# Patient Record
Sex: Male | Born: 1935 | Race: White | Hispanic: No | Marital: Married | Smoking: Former smoker
Health system: Southern US, Community
[De-identification: ages and names within clinical notes are randomized; demographics above are authoritative.]

## PROBLEM LIST (undated history)

## (undated) DIAGNOSIS — K5730 Diverticulosis of large intestine without perforation or abscess without bleeding: Secondary | ICD-10-CM

## (undated) DIAGNOSIS — E785 Hyperlipidemia, unspecified: Secondary | ICD-10-CM

## (undated) DIAGNOSIS — K219 Gastro-esophageal reflux disease without esophagitis: Secondary | ICD-10-CM

## (undated) DIAGNOSIS — N400 Enlarged prostate without lower urinary tract symptoms: Secondary | ICD-10-CM

## (undated) DIAGNOSIS — M1990 Unspecified osteoarthritis, unspecified site: Secondary | ICD-10-CM

## (undated) DIAGNOSIS — I2510 Atherosclerotic heart disease of native coronary artery without angina pectoris: Secondary | ICD-10-CM

## (undated) DIAGNOSIS — Z86010 Personal history of colonic polyps: Secondary | ICD-10-CM

## (undated) HISTORY — DX: Atherosclerotic heart disease of native coronary artery without angina pectoris: I25.10

## (undated) HISTORY — DX: Hyperlipidemia, unspecified: E78.5

## (undated) HISTORY — DX: Gastro-esophageal reflux disease without esophagitis: K21.9

## (undated) HISTORY — DX: Benign prostatic hyperplasia without lower urinary tract symptoms: N40.0

## (undated) HISTORY — DX: Personal history of colonic polyps: Z86.010

## (undated) HISTORY — DX: Diverticulosis of large intestine without perforation or abscess without bleeding: K57.30

## (undated) HISTORY — PX: CORONARY ARTERY BYPASS GRAFT: SHX141

## (undated) HISTORY — DX: Unspecified osteoarthritis, unspecified site: M19.90

## (undated) HISTORY — PX: CATARACT EXTRACTION: SUR2

---

## 2005-05-16 ENCOUNTER — Ambulatory Visit: Payer: Self-pay | Admitting: Internal Medicine

## 2006-05-15 ENCOUNTER — Ambulatory Visit: Payer: Self-pay | Admitting: Internal Medicine

## 2007-03-14 ENCOUNTER — Inpatient Hospital Stay (HOSPITAL_COMMUNITY): Admission: EM | Admit: 2007-03-14 | Discharge: 2007-03-23 | Payer: Self-pay | Admitting: Emergency Medicine

## 2007-03-14 ENCOUNTER — Ambulatory Visit: Payer: Self-pay | Admitting: Cardiology

## 2007-03-16 ENCOUNTER — Encounter: Payer: Self-pay | Admitting: Internal Medicine

## 2007-03-16 ENCOUNTER — Encounter (INDEPENDENT_AMBULATORY_CARE_PROVIDER_SITE_OTHER): Payer: Self-pay | Admitting: Specialist

## 2007-03-18 ENCOUNTER — Encounter: Payer: Self-pay | Admitting: Vascular Surgery

## 2007-03-18 ENCOUNTER — Ambulatory Visit: Payer: Self-pay | Admitting: Surgery

## 2007-03-20 ENCOUNTER — Ambulatory Visit: Payer: Self-pay | Admitting: Internal Medicine

## 2007-04-02 ENCOUNTER — Ambulatory Visit: Payer: Self-pay | Admitting: Cardiovascular Disease

## 2007-04-02 ENCOUNTER — Ambulatory Visit: Payer: Self-pay

## 2007-04-02 LAB — CONVERTED CEMR LAB
CO2: 30 meq/L (ref 19–32)
Chloride: 106 meq/L (ref 96–112)
Creatinine, Ser: 1 mg/dL (ref 0.4–1.5)
Glucose, Bld: 95 mg/dL (ref 70–99)
Potassium: 4.6 meq/L (ref 3.5–5.1)
Pro B Natriuretic peptide (BNP): 312 pg/mL — ABNORMAL HIGH (ref 0.0–100.0)
Sodium: 141 meq/L (ref 135–145)

## 2007-04-05 ENCOUNTER — Ambulatory Visit: Payer: Self-pay | Admitting: Cardiothoracic Surgery

## 2007-04-09 ENCOUNTER — Ambulatory Visit: Payer: Self-pay | Admitting: Cardiology

## 2007-04-09 LAB — CONVERTED CEMR LAB
BUN: 15 mg/dL (ref 6–23)
Basophils Relative: 1.3 % — ABNORMAL HIGH (ref 0.0–1.0)
CO2: 35 meq/L — ABNORMAL HIGH (ref 19–32)
Eosinophils Absolute: 0.1 10*3/uL (ref 0.0–0.6)
GFR calc Af Amer: 75 mL/min
GFR calc non Af Amer: 62 mL/min
Hemoglobin: 11.2 g/dL — ABNORMAL LOW (ref 13.0–17.0)
Lymphocytes Relative: 20.1 % (ref 12.0–46.0)
MCHC: 33.8 g/dL (ref 30.0–36.0)
MCV: 94.8 fL (ref 78.0–100.0)
Monocytes Absolute: 0.6 10*3/uL (ref 0.2–0.7)
Monocytes Relative: 10.3 % (ref 3.0–11.0)
Neutro Abs: 3.9 10*3/uL (ref 1.4–7.7)
Potassium: 4.5 meq/L (ref 3.5–5.1)

## 2007-04-16 ENCOUNTER — Ambulatory Visit: Payer: Self-pay | Admitting: Internal Medicine

## 2007-04-17 LAB — CONVERTED CEMR LAB
Basophils Absolute: 0.1 10*3/uL (ref 0.0–0.1)
Eosinophils Absolute: 0.2 10*3/uL (ref 0.0–0.6)
MCHC: 33.5 g/dL (ref 30.0–36.0)
MCV: 96.3 fL (ref 78.0–100.0)
Platelets: 211 10*3/uL (ref 150–400)
RBC: 3.47 M/uL — ABNORMAL LOW (ref 4.22–5.81)
WBC: 8 10*3/uL (ref 4.5–10.5)

## 2007-04-19 ENCOUNTER — Encounter: Payer: Self-pay | Admitting: Internal Medicine

## 2007-04-19 ENCOUNTER — Ambulatory Visit: Payer: Self-pay | Admitting: Internal Medicine

## 2007-04-30 ENCOUNTER — Ambulatory Visit: Payer: Self-pay | Admitting: Thoracic Surgery (Cardiothoracic Vascular Surgery)

## 2007-04-30 ENCOUNTER — Encounter
Admission: RE | Admit: 2007-04-30 | Discharge: 2007-04-30 | Payer: Self-pay | Admitting: Thoracic Surgery (Cardiothoracic Vascular Surgery)

## 2007-04-30 ENCOUNTER — Encounter: Payer: Self-pay | Admitting: Internal Medicine

## 2007-05-03 ENCOUNTER — Ambulatory Visit: Payer: Self-pay | Admitting: Cardiology

## 2007-05-14 ENCOUNTER — Ambulatory Visit: Payer: Self-pay | Admitting: Internal Medicine

## 2007-05-14 LAB — CONVERTED CEMR LAB
Cholesterol: 103 mg/dL (ref 0–200)
PSA: 0.87 ng/mL (ref 0.10–4.00)

## 2007-06-14 DIAGNOSIS — Z860100 Personal history of colon polyps, unspecified: Secondary | ICD-10-CM | POA: Insufficient documentation

## 2007-06-14 DIAGNOSIS — I2510 Atherosclerotic heart disease of native coronary artery without angina pectoris: Secondary | ICD-10-CM

## 2007-06-14 DIAGNOSIS — K219 Gastro-esophageal reflux disease without esophagitis: Secondary | ICD-10-CM | POA: Insufficient documentation

## 2007-06-14 DIAGNOSIS — K5730 Diverticulosis of large intestine without perforation or abscess without bleeding: Secondary | ICD-10-CM | POA: Insufficient documentation

## 2007-06-14 HISTORY — DX: Gastro-esophageal reflux disease without esophagitis: K21.9

## 2007-06-14 HISTORY — DX: Personal history of colon polyps, unspecified: Z86.0100

## 2007-06-14 HISTORY — DX: Atherosclerotic heart disease of native coronary artery without angina pectoris: I25.10

## 2007-06-14 HISTORY — DX: Diverticulosis of large intestine without perforation or abscess without bleeding: K57.30

## 2007-06-14 HISTORY — DX: Personal history of colonic polyps: Z86.010

## 2007-07-10 ENCOUNTER — Ambulatory Visit: Payer: Self-pay | Admitting: Internal Medicine

## 2007-07-10 DIAGNOSIS — E785 Hyperlipidemia, unspecified: Secondary | ICD-10-CM | POA: Insufficient documentation

## 2007-07-10 DIAGNOSIS — M1990 Unspecified osteoarthritis, unspecified site: Secondary | ICD-10-CM

## 2007-07-10 DIAGNOSIS — I2510 Atherosclerotic heart disease of native coronary artery without angina pectoris: Secondary | ICD-10-CM

## 2007-07-10 DIAGNOSIS — N400 Enlarged prostate without lower urinary tract symptoms: Secondary | ICD-10-CM

## 2007-07-10 HISTORY — DX: Benign prostatic hyperplasia without lower urinary tract symptoms: N40.0

## 2007-07-10 HISTORY — DX: Atherosclerotic heart disease of native coronary artery without angina pectoris: I25.10

## 2007-07-10 HISTORY — DX: Hyperlipidemia, unspecified: E78.5

## 2007-07-10 HISTORY — DX: Unspecified osteoarthritis, unspecified site: M19.90

## 2008-01-01 ENCOUNTER — Ambulatory Visit: Payer: Self-pay | Admitting: Internal Medicine

## 2008-01-03 ENCOUNTER — Encounter: Payer: Self-pay | Admitting: Internal Medicine

## 2008-01-04 ENCOUNTER — Encounter: Payer: Self-pay | Admitting: Internal Medicine

## 2008-04-18 ENCOUNTER — Encounter: Payer: Self-pay | Admitting: Internal Medicine

## 2008-05-13 ENCOUNTER — Ambulatory Visit: Payer: Self-pay | Admitting: Internal Medicine

## 2008-05-13 LAB — CONVERTED CEMR LAB
ALT: 21 units/L (ref 0–53)
Basophils Relative: 0.7 % (ref 0.0–1.0)
Bilirubin, Direct: 0.1 mg/dL (ref 0.0–0.3)
CO2: 31 meq/L (ref 19–32)
Calcium: 9.7 mg/dL (ref 8.4–10.5)
Creatinine, Ser: 1.2 mg/dL (ref 0.4–1.5)
Glucose, Bld: 104 mg/dL — ABNORMAL HIGH (ref 70–99)
Hemoglobin: 14.2 g/dL (ref 13.0–17.0)
LDL Cholesterol: 57 mg/dL (ref 0–99)
Lymphocytes Relative: 23.4 % (ref 12.0–46.0)
Monocytes Relative: 8.2 % (ref 3.0–12.0)
Neutro Abs: 5 10*3/uL (ref 1.4–7.7)
RBC: 4.17 M/uL — ABNORMAL LOW (ref 4.22–5.81)
Sodium: 143 meq/L (ref 135–145)
TSH: 1.25 microintl units/mL (ref 0.35–5.50)
Total CHOL/HDL Ratio: 2.7
Total Protein: 7.3 g/dL (ref 6.0–8.3)
WBC: 7.6 10*3/uL (ref 4.5–10.5)

## 2008-09-02 ENCOUNTER — Ambulatory Visit: Payer: Self-pay | Admitting: Internal Medicine

## 2008-09-02 DIAGNOSIS — R209 Unspecified disturbances of skin sensation: Secondary | ICD-10-CM | POA: Insufficient documentation

## 2009-08-14 ENCOUNTER — Ambulatory Visit: Payer: Self-pay | Admitting: Internal Medicine

## 2010-02-12 ENCOUNTER — Ambulatory Visit: Payer: Self-pay | Admitting: Internal Medicine

## 2010-08-20 ENCOUNTER — Encounter: Payer: Self-pay | Admitting: Internal Medicine

## 2010-08-20 ENCOUNTER — Ambulatory Visit: Payer: Self-pay | Admitting: Internal Medicine

## 2010-08-20 LAB — CONVERTED CEMR LAB
ALT: 13 units/L (ref 0–53)
AST: 18 units/L (ref 0–37)
BUN: 25 mg/dL — ABNORMAL HIGH (ref 6–23)
Basophils Relative: 0.4 % (ref 0.0–3.0)
Chloride: 104 meq/L (ref 96–112)
Cholesterol: 177 mg/dL (ref 0–200)
Eosinophils Absolute: 0.1 10*3/uL (ref 0.0–0.7)
Eosinophils Relative: 0.6 % (ref 0.0–5.0)
GFR calc non Af Amer: 66.74 mL/min (ref >60)
HDL: 44.3 mg/dL (ref >39.00)
LDL Cholesterol: 115 mg/dL — ABNORMAL HIGH (ref 0–99)
Lymphocytes Relative: 40.5 % (ref 12.0–46.0)
MCHC: 34.1 g/dL (ref 30.0–36.0)
Neutrophils Relative %: 51.4 % (ref 43.0–77.0)
Platelets: 183 10*3/uL (ref 150.0–400.0)
Potassium: 4.7 meq/L (ref 3.5–5.1)
RBC: 4.04 M/uL — ABNORMAL LOW (ref 4.22–5.81)
Sodium: 141 meq/L (ref 135–145)
Total Protein: 7.2 g/dL (ref 6.0–8.3)
VLDL: 17.6 mg/dL (ref 0.0–40.0)
WBC: 8.8 10*3/uL (ref 4.5–10.5)

## 2010-12-26 LAB — CONVERTED CEMR LAB
ALT: 19 units/L (ref 0–53)
AST: 18 units/L (ref 0–37)
Albumin: 4 g/dL (ref 3.5–5.2)
Alkaline Phosphatase: 75 units/L (ref 39–117)
Basophils Relative: 0.3 % (ref 0.0–3.0)
Eosinophils Relative: 1 % (ref 0.0–5.0)
GFR calc non Af Amer: 68.32 mL/min (ref >60)
Glucose, Bld: 93 mg/dL (ref 70–99)
HCT: 41.5 % (ref 39.0–52.0)
Hemoglobin: 14.2 g/dL (ref 13.0–17.0)
Lymphs Abs: 2.7 10*3/uL (ref 0.7–4.0)
Monocytes Relative: 7.6 % (ref 3.0–12.0)
Neutro Abs: 4 10*3/uL (ref 1.4–7.7)
Potassium: 4.8 meq/L (ref 3.5–5.1)
RBC: 4.1 M/uL — ABNORMAL LOW (ref 4.22–5.81)
RDW: 12 % (ref 11.5–14.6)
Sodium: 143 meq/L (ref 135–145)
TSH: 1.13 microintl units/mL (ref 0.35–5.50)
Total CHOL/HDL Ratio: 3
VLDL: 16.4 mg/dL (ref 0.0–40.0)
WBC: 7.4 10*3/uL (ref 4.5–10.5)

## 2010-12-30 NOTE — Assessment & Plan Note (Signed)
Summary: 6 mo rov/mm   Vital Signs:  [name] profile:   77 year old male Weight:      183 pounds Temp:     98.3 degrees F oral BP sitting:   130 / 80  (left arm) Cuff size:   regular  Vitals Entered By: Duard Brady LPN (February 12, 2010 8:19 AM) CC: 6 mos rov - doing well Is [name] Diabetic? No   CC:  6 mos rov - doing well.  History of Present Illness: [name] who is seen today for follow-up.  He has a history of coronary artery is dyslipidemia, and mild osteoarthritis.  He does remarkably well.  He is on Lipitor and also Viagra for EGD.  Denies any cardiac symptoms.  He remains quite stable and active.  Laboratory studies were reviewed from the fall and cholesterol was 143.  He has a history of mild gastroesophageal reflux disease controlled with dietary measures.  Allergies (verified): No Known Drug Allergies  Past History:  Past Medical History: Reviewed history from 05/13/2008 and no changes required. Colonic polyps, hx of Diverticulosis, colon GERD Hyperlipidemia Benign prostatic hypertrophy Coronary artery disease status post non-Q-wave MI, April 2008; status post CABG antral ulcer, April 2008 ED  Family History: Reviewed history from 08/14/2009 and no changes required. both parents living to the 90s  he has 4 brothers two sisters- all living  no family history of cancer  Review of Systems  The [name] denies anorexia, fever, weight loss, weight gain, vision loss, decreased hearing, hoarseness, chest pain, syncope, dyspnea on exertion, peripheral edema, prolonged cough, headaches, hemoptysis, abdominal pain, melena, hematochezia, severe indigestion/heartburn, hematuria, incontinence, genital sores, muscle weakness, suspicious skin lesions, transient blindness, difficulty walking, depression, unusual weight change, abnormal bleeding, enlarged lymph nodes, angioedema, breast masses, and testicular masses.    Physical Exam  General:   Well-developed,well-nourished,in no acute distress; alert,appropriate and cooperative throughout examination Head:  Normocephalic and atraumatic without obvious abnormalities. No apparent alopecia or balding. Mouth:  Oral mucosa and oropharynx without lesions or exudates.  Teeth in good repair. Neck:  No deformities, masses, or tenderness noted. Lungs:  Normal respiratory effort, chest expands symmetrically. Lungs are clear to auscultation, no crackles or wheezes. Heart:  Normal rate and regular rhythm. S1 and S2 normal without gallop, murmur, click, rub or other extra sounds. Abdomen:  Bowel sounds positive,abdomen soft and non-tender without masses, organomegaly or hernias noted. Msk:  No deformity or scoliosis noted of thoracic or lumbar spine.   Pulses:  dorsalis pedis pulses, full Extremities:  No clubbing, cyanosis, edema, or deformity noted with normal full range of motion of all joints.     Impression & Recommendations:  Problem # 1:  ATHEROSCLEROTIC CARDIOVASCULAR DISEASE (ICD-429.2)  His updated medication list for this problem includes:    Bayer Aspirin 325 Mg Tabs (Aspirin) .Marland Kitchen... As needed  His updated medication list for this problem includes:    Bayer Aspirin 325 Mg Tabs (Aspirin) .Marland Kitchen... As needed  Problem # 2:  HYPERLIPIDEMIA (ICD-272.4)  His updated medication list for this problem includes:    Lipitor 80 Mg Tabs (Atorvastatin calcium) .Marland Kitchen... 1 once daily  His updated medication list for this problem includes:    Lipitor 80 Mg Tabs (Atorvastatin calcium) .Marland Kitchen... 1 once daily  Complete Medication List: 1)  Lipitor 80 Mg Tabs (Atorvastatin calcium) .Marland Kitchen.. 1 once daily 2)  Viagra 100 Mg Tabs (Sildenafil citrate) .... As directed 3)  Bayer Aspirin 325 Mg Tabs (Aspirin) .... As needed  [name]  Instructions: 1)  Please schedule a follow-up appointment in 6 months. 2)  Advised not to eat any food or drink any liquids after 10 PM the night before your procedure. 3)  It is  important that you exercise regularly at least 20 minutes 5 times a week. If you develop chest pain, have severe difficulty breathing, or feel very tired , stop exercising immediately and seek medical attention. 4)  Take an Aspirin every day. Prescriptions: VIAGRA 100 MG  TABS (SILDENAFIL CITRATE) as directed  #12 x 6   Entered and Authorized by:   Gordy Savers  MD   Signed by:   Gordy Savers  MD on 02/12/2010   Method used:   Print then Give to [name]   RxID:   [id number] LIPITOR 80 MG  TABS (ATORVASTATIN CALCIUM) 1 once daily  #90 Tablet x 6   Entered and Authorized by:   Gordy Savers  MD   Signed by:   Gordy Savers  MD on 02/12/2010   Method used:   Print then Give to [name]   RxID:   [id number]   Appended Document: 6 mo rov/mm     Allergies: No Known Drug Allergies   Complete Medication List: 1)  Lipitor 80 Mg Tabs (Atorvastatin calcium) .Marland Kitchen.. 1 once daily 2)  Viagra 100 Mg Tabs (Sildenafil citrate) .... As directed 3)  Bayer Aspirin 325 Mg Tabs (Aspirin) .... As needed 4)  Ester-c Tabs (Bioflavonoid products) .... Qd 5)  Fish Oil 300 Mg Caps (Omega-3 fatty acids) .... 3qd 6)  Vitamin E 1000 Unit Caps (Vitamin e) .... Qd

## 2010-12-30 NOTE — Assessment & Plan Note (Signed)
Summary: EMP---WILL FAST//CCM   Vital Signs:  Patient profile:   77 year old male Height:      71.5 inches Weight:      175 pounds BMI:     24.15 Temp:     98.0 degrees F oral BP sitting:   140 / 80  (left arm) Cuff size:   regular  Vitals Entered By: Duard Brady LPN (August 20, 2010 8:39 AM) CC: cpx - doing well Is Patient Diabetic? No   CC:  cpx - doing well.  History of Present Illness: 77 year old patient who is in today for a comprehensive evaluation.  He has a history of coronary artery disease and is status post coronary artery bypass grafting.  He has osteoarthritis, which is fairly mild.  Additionally, he has treated dyslipidemia, and a history of BPH.  There is diverticulosis and mild gastroesophageal reflux disease.  He has remote history of colonic polyps. Here for Medicare AWV:  1.   Risk factors based on Past M, S, F history:  patient has known coronary artery disease, status post CABG.  Continues aggressive risk factor modification 2.   Physical Activities: no  limitations quite active 3.   Depression/mood: history of depression or mood disorder 4.   Hearing: no significant impairment 5.   ADL's: independent in all aspects of daily living 6.   Fall Risk: low 7.   Home Safety: no problems identified 8.   Height, weight, &visual acuity:height and weight stable.  No difficulty with visual acuity.  He states that he does not even require reading glasses 9.   Counseling: heart healthy diet regular exercise.  All encouraged 10.   Labs ordered based on risk factors: laboratory studies, including lipid profile will review 11.           Referral Coordination-none appropriate at this time 12.           Care Plan- heart healthy diet and regular.  Exercise regimen will be continued.  Will continue aggressive risk factor modification.  That includes aspirin and Lipitor 13.            Cognitive Assessment- alert and oriented, with normal affect.  No cognitive  dysfunction.  Continues to handle all his executive functions without difficulty   Allergies (verified): No Known Drug Allergies  Past History:  Past Medical History: Reviewed history from 05/13/2008 and no changes required. Colonic polyps, hx of Diverticulosis, colon GERD Hyperlipidemia Benign prostatic hypertrophy Coronary artery disease status post non-Q-wave MI, April 2008; status post CABG antral ulcer, April 2008 ED  Past Surgical History: Reviewed history from 08/14/2009 and no changes required. status post CABG April 2008 colonoscopy May 2009 Cataract extraction 2009  Family History: Reviewed history from 08/14/2009 and no changes required. both parents living to the 90s  he has 4 brothers two sisters- all living  no family history of cancer  Social History: Reviewed history from 08/14/2009 and no changes required. Married Regular exercise-yes  Review of Systems  The patient denies anorexia, fever, weight loss, weight gain, vision loss, decreased hearing, hoarseness, chest pain, syncope, dyspnea on exertion, peripheral edema, prolonged cough, headaches, hemoptysis, abdominal pain, melena, hematochezia, severe indigestion/heartburn, hematuria, incontinence, genital sores, muscle weakness, suspicious skin lesions, transient blindness, difficulty walking, depression, unusual weight change, abnormal bleeding, enlarged lymph nodes, angioedema, breast masses, and testicular masses.    Physical Exam  General:  Well-developed,well-nourished,in no acute distress; alert,appropriate and cooperative throughout examination Head:  Normocephalic and atraumatic without obvious abnormalities. No apparent  alopecia or balding. Eyes:  No corneal or conjunctival inflammation noted. EOMI. Perrla. Funduscopic exam benign, without hemorrhages, exudates or papilledema. Vision grossly normal. Ears:  External ear exam shows no significant lesions or deformities.  Otoscopic examination  reveals clear canals, tympanic membranes are intact bilaterally without bulging, retraction, inflammation or discharge. Hearing is grossly normal bilaterally. Nose:  External nasal examination shows no deformity or inflammation. Nasal mucosa are pink and moist without lesions or exudates. Mouth:  Oral mucosa and oropharynx without lesions or exudates.  Teeth in good repair. Neck:  No deformities, masses, or tenderness noted. Chest Wall:  No deformities, masses, tenderness or gynecomastia noted. Breasts:  No masses or gynecomastia noted Lungs:  Normal respiratory effort, chest expands symmetrically. Lungs are clear to auscultation, no crackles or wheezes. Heart:  Normal rate and regular rhythm. S1 and S2 normal without gallop, murmur, click, rub or other extra sounds. Abdomen:  Bowel sounds positive,abdomen soft and non-tender without masses, organomegaly or hernias noted. Rectal:  No external abnormalities noted. Normal sphincter tone. No rectal masses or tenderness. Genitalia:  Testes bilaterally descended without nodularity, tenderness or masses. No scrotal masses or lesions. No penis lesions or urethral discharge. Prostate:  plus two symmetrical firm Msk:  No deformity or scoliosis noted of thoracic or lumbar spine.   Pulses:  R and L carotid,radial,femoral,dorsalis pedis and posterior tibial pulses are full and equal bilaterally Extremities:  No clubbing, cyanosis, edema, or deformity noted with normal full range of motion of all joints.   Neurologic:  No cranial nerve deficits noted. Station and gait are normal. Plantar reflexes are down-going bilaterally. DTRs are symmetrical throughout. Sensory, motor and coordinative functions appear intact. Skin:  Intact without suspicious lesions or rashes Cervical Nodes:  No lymphadenopathy noted Axillary Nodes:  No palpable lymphadenopathy Inguinal Nodes:  No significant adenopathy Psych:  Cognition and judgment appear intact. Alert and cooperative  with normal attention span and concentration. No apparent delusions, illusions, hallucinations   Impression & Recommendations:  Problem # 1:  ATHEROSCLEROTIC CARDIOVASCULAR DISEASE (ICD-429.2)  His updated medication list for this problem includes:    Bayer Aspirin 325 Mg Tabs (Aspirin) .Marland Kitchen... As needed  Orders: EKG w/ Interpretation (93000) Venipuncture (16109) TLB-Lipid Panel (80061-LIPID) TLB-BMP (Basic Metabolic Panel-BMET) (80048-METABOL) TLB-CBC Platelet - w/Differential (85025-CBCD) TLB-Hepatic/Liver Function Pnl (80076-HEPATIC) TLB-TSH (Thyroid Stimulating Hormone) (84443-TSH)  His updated medication list for this problem includes:    Bayer Aspirin 325 Mg Tabs (Aspirin) .Marland Kitchen... As needed  Problem # 2:  HYPERLIPIDEMIA (ICD-272.4)  His updated medication list for this problem includes:    Lipitor 80 Mg Tabs (Atorvastatin calcium) .Marland Kitchen... 1 once daily  His updated medication list for this problem includes:    Lipitor 80 Mg Tabs (Atorvastatin calcium) .Marland Kitchen... 1 once daily  Orders: Venipuncture (60454) TLB-Lipid Panel (80061-LIPID) TLB-BMP (Basic Metabolic Panel-BMET) (80048-METABOL) TLB-CBC Platelet - w/Differential (85025-CBCD) TLB-Hepatic/Liver Function Pnl (80076-HEPATIC) TLB-TSH (Thyroid Stimulating Hormone) (84443-TSH)  Complete Medication List: 1)  Lipitor 80 Mg Tabs (Atorvastatin calcium) .Marland Kitchen.. 1 once daily 2)  Viagra 100 Mg Tabs (Sildenafil citrate) .... As directed 3)  Bayer Aspirin 325 Mg Tabs (Aspirin) .... As needed 4)  Vitamin E 1000 Unit Caps (Vitamin e) .... Qd 5)  Ferrous Sulfate 325 (65 Fe) Mg Tabs (Ferrous sulfate) .... Qd 6)  Lutein 6 Mg Caps (Lutein) .... Qd  Other Orders: Medicare -1st Annual Wellness Visit [phone])  Patient Instructions: 1)  Please schedule a follow-up appointment in 1 year. 2)  Limit your Sodium (Salt) to less  than 2 grams a day(slightly less than 1/2 a teaspoon) to prevent fluid retention, swelling, or worsening of symptoms. 3)   It is important that you exercise regularly at least 20 minutes 5 times a week. If you develop chest pain, have severe difficulty breathing, or feel very tired , stop exercising immediately and seek medical attention. Prescriptions: VIAGRA 100 MG  TABS (SILDENAFIL CITRATE) as directed  #12 x 6   Entered and Authorized by:   Gordy Savers  MD   Signed by:   Gordy Savers  MD on 08/20/2010   Method used:   Print then Give to Patient   RxID:   [id number] LIPITOR 80 MG  TABS (ATORVASTATIN CALCIUM) 1 once daily  #90 Tablet x 6   Entered and Authorized by:   Gordy Savers  MD   Signed by:   Gordy Savers  MD on 08/20/2010   Method used:   Print then Give to Patient   RxID:   [id number]  of the  Appended Document: EMP---WILL FAST//CCM     Allergies: No Known Drug Allergies   Complete Medication List: 1)  Lipitor 80 Mg Tabs (Atorvastatin calcium) .Marland Kitchen.. 1 once daily 2)  Viagra 100 Mg Tabs (Sildenafil citrate) .... As directed 3)  Bayer Aspirin 325 Mg Tabs (Aspirin) .... As needed 4)  Vitamin E 1000 Unit Caps (Vitamin e) .... Qd 5)  Ferrous Sulfate 325 (65 Fe) Mg Tabs (Ferrous sulfate) .... Qd 6)  Lutein 6 Mg Caps (Lutein) .... Qd  Other Orders: Influenza Vaccine MCR (32355)    Immunizations Administered:  Influenza Vaccine # 1:    Vaccine Type: Fluvax MCR    Site: left deltoid    Mfr: GlaxoSmithKline    Dose: 0.5 ml    Route: IM    Given by: Duard Brady LPN    Exp. Date: 05/28/2011    Lot #: [id number]    VIS given: 06/22/10 version given August 20, 2010.    Physician counseled: yes  Flu Vaccine Consent Questions:    Do you have a history of severe allergic reactions to this vaccine? no    Any prior history of allergic reactions to egg and/or gelatin? no    Do you have a sensitivity to the preservative Thimersol? no    Do you have a past history of Guillan-Barre Syndrome? no    Do you currently have an acute febrile illness?  no    Have you ever had a severe reaction to latex? no    Vaccine information given and explained to patient? yes  Appended Document: EMP---WILL FAST//CCM spoke with pt - didnt have labs done that day due to lab so busy. States he will come by next monday to have drawn. KIK

## 2010-12-30 NOTE — Letter (Signed)
Summary: [facility]  [facility]   Imported By: Maryln Gottron 04/21/2010 14:13:34  _____________________________________________________________________  External Attachment:    Type:   Image     Comment:   External Document

## 2011-04-12 NOTE — Assessment & Plan Note (Signed)
OFFICE VISIT   [name], [name]  DOB:  26-Dec-1925                                        April 30, 2007  CHART #:  [id number]   [name] is a 77 year old gentleman who presented with anemia and a  non-Q-wave myocardial infarction.  An upper endoscopy revealed an ulcer.  He also had a low endoscopy later on which revealed 3 polyps which were  excised.  He was seen in consultation by Dr. Laneta Simmers.  He underwent  coronary bypass grafting on March 19, 2007.  Five bypasses were done.  We did endoscopic vein harvest from both thighs.  Postoperatively, in  the hospital he did not have any significant complications.  He did see  Dr. Tyrone Sage on May 8th, with a complaint of increased edema in his left  leg.  His left leg was significantly bigger than his right.  He had an  ultrasound which showed no evidence of DVT.  He was started on Lasix and  leg elevation and has seen improvements since then.   CURRENT MEDICATIONS:  1. Aspirin 325 mg daily.  2. Niferex 150 mg daily.  3. Lipitor 80 mg daily.  4. Protonix 40 mg daily.  5. Klor-Con 20 mEq daily.  6. He had been on Lasix, but that was stopped.  7. Metoprolol 25 mg b.i.d.  8. He has hydrocodone for pain but has not had to use it recently.   PHYSICAL EXAMINATION:  GENERAL:  [name] is a 77 year old male in no  acute distress.  VITAL SIGNS:  Blood pressure is 130/68, pulse is 56, respirations are  18.  His oxygen saturations are 99% on room air.  LUNGS:  Clear with equal breath sounds bilaterally.  CARDIAC:  Regular rate and rhythm.  Normal S1 and S2.  His sternum is  stable.  Sternum incision is clean, dry and intact.  EXTREMITIES:  His leg incisions are healing well.  He does still have 2+  edema in his left lower lobe, 1+ in the right.   Chest x-ray shows good aeration of the lungs with no significant  effusions bilaterally.   IMPRESSION:  [name] is a 77 year old gentleman.  He is status post  coronary artery bypass grafting x5, about 6 weeks ago.  He is doing very  well at this point in time.  His exercise tolerance is good.  He is  having minimal discomfort.  He did have some problems with peripheral  edema.  That has improved.  He is currently off the Lasix.  He is still  taking the potassium.  I instructed him that he did not need to take the  potassium, unless he was taking the Lasix, but I did advise him to  finish out his Lasix, because he still does have some edema in that left  leg, and I think that will help resolve it.  Other than that, he may  begin increasing his physical activities.  He does not have to be  concerned about the amount of weight he is lifting at this point.  He  should get on a regular exercise program.  He may begin driving on a  limited basis.  Appropriate precautions were discussed.  He may begin  driving on a limited basis.  Appropriate precautions were discussed.  He  is anxious to go  to [employer] this weekend.  His grandson is graduating  from high school.  I told him that should not be a problem, but he  should not drive to [place], but he is fine to ride in a car down  there.  He should get out and walk around every hour to hour and a half  during the trip.  He will continued to be followed by Dr. Rollene Rotunda  and Dr. Eleonore Chiquito.  I would be happy to see him back anytime, if  I can be of any further assistance with his care.   Salvatore Decent Dorris Fetch, M.D.  Electronically Signed   SCH/MEDQ  D:  04/30/2007  T:  04/30/2007  Job:  [id number]   cc:   Bevelyn Buckles. Bensimhon, MD  Gordy Savers, MD  Iva Boop, MD,FACG

## 2011-04-12 NOTE — Discharge Summary (Signed)
[name], [name]              ACCOUNT NO.:  [id number]   MEDICAL RECORD NO.:  [id number]          PATIENT TYPE:  INP   LOCATION:  2027                         FACILITY:  [facility]   PHYSICIAN:  Salvatore Decent. Dorris Fetch, M.D.DATE OF BIRTH:  1926/05/22   DATE OF ADMISSION:  03/14/2007  DATE OF DISCHARGE:  03/23/2007                               DISCHARGE SUMMARY   HISTORY OF PRESENT ILLNESS:  The patient is a 77 year old married white  male with no prior history of coronary artery disease.  He is very  active and without limitations normally.  On the Friday prior to  admission, he had a seafood dinner and awoke early the next morning with  multiple episodes of nausea and vomiting.  He denied coffee-ground.  He  felt weak all day Saturday, Sunday and Monday and awoke on Tuesday at  3:00 a.m. with 10/10 substernal chest pressure with radiation to the  left arm with associated diaphoresis, shortness of breath, lasting  approximately 30 minutes and resolving spontaneously.  Eventually, he  fell back asleep, and when he awoke later that day he noted dyspnea and  recurrent exertional discomfort with any amount of exertion.  Because of  these symptoms, he presented to Urgent Care where he told them he may  have food poisoning from the fish he had eaten on Friday night.  Apparently they agreed and gave him a prescription for oral Cipro.  He  continued to have exertional angina and dyspnea and this prompted him to  present to [facility] emergency department.  An EKG showed an  upsloping ST-segment depression, but otherwise no acute changes, and his  initial troponin was 0.80.  Of note on admission, the patient's  hemoglobin/hematocrit were 8.6 and 25.5.  He was felt to require  admission for further evaluation and treatment of his anemia as well as  unstable angina.   ALLERGIES:  NO KNOWN DRUG ALLERGIES.   MEDICATIONS PRIOR TO ADMISSION:  Cipro.   PAST MEDICAL HISTORY:  1. History  of gastroesophageal reflux.  2. History of benign prostatic hyperplasia.  3. History of diverticulosis with flexible sigmoidoscopy done in 2001.  4. History of mild hyperlipidemia, not on medication.   For family history, social history, review of systems and physical exam,  please see the history and physical done at the time of admission.   HOSPITAL COURSE:  The patient was admitted with a non-ST elevation  myocardial infarction.  He was admitted.  He was transfused.  A  gastroenterology consultation was obtained.  The patient also would  require cardiac catheterization when medically stable.  He was seen by  gastroenterology and felt to require EGD.  This was done on March 16, 2007.  Findings included a clean based antral ulcer felt to be low risk  for bleeding.  Additional findings were consistent with a erosive  gastritis and reflux esophagitis.  Recommendations included proton pump  inhibitor.  Of note, the anemia is not felt to be attributed entirely to  this finding.  It is felt, he will require colonoscopy at a later date.  It was  felt, however, after the EGD, that the patient could proceed with  cardiac catheterization and this was performed on March 16, 2007, by Dr.  Antoine Poche.  Findings included severe three-vessel coronary artery disease  with preserved left ventricular function.  The lesions were as follows.  The left main had ostial 60% stenosis.  There was damping of the  pressure wave forms with catheter engagement.  The LAD and a mid 90%  stenosis after the first septal perforator but before the first  diagonal.  There was a 99% stenosis after the diagonal.  The diagonal  was a moderate sized vessel with proximal aneurysmal dilatation and 80%  stenosis.  The circumflex and the AV groove had luminal irregularities.  The ramus intermediate was a moderate-sized narrow caliber vessel.  It  had proximal 980% stenosis.  The obtuse marginal one was moderate size  of the  proximal 80% stenosis.  The right coronary artery with a large  dominant vessel.  There were proximal tandem 80-90% lesions.  The PDA  was moderate to small with ostial 80% stenosis.  There were too small  posterolateral branches which were free of high-grade disease.  Due to  these findings, cardiac surgical consultation was obtained with Evelene Croon, MD who evaluated the patient and studies and recommended  proceeding with surgical revascularization.  Due to scheduling, Dr.  Dorris Fetch was to perform the surgery.   PROCEDURE:  On March 19, 2007, he was taken to the operating room at  which time he underwent the following procedure coronary artery bypass  grafting times five.  The following grafts were placed:  Left internal  mammary artery to left anterior descending, saphenous vein graft to the  posterior descending, composite saphenous vein graft to the first  diagonal, sequential saphenous vein graft to the ramus intermedius and  obtuse marginal.  The patient tolerated the procedure well and was taken  to the Surgical Intensive Care Unit in stable condition.   POSTOPERATIVE HOSPITAL COURSE:  The patient has done well.  He has  maintained stable hemodynamics.  He did require blood transfusion for  postoperative exacerbation of his anemia.  He has stabilized in this  regard.  Most recent hemoglobin/hematocrit are 8.3 and 24.5,  respectively, on March 22, 2007.  Iron has been added to his regimen.  Electrolytes, BUN and creatinine are within normal limits.  He has  required a gentle diuresis.  Oxygen has been weaned and he maintains  good saturations on room air.  He has maintained normal sinus rhythm  postoperatively.  His incisions are healing well without evidence of  infection.  He is tolerating activity commensurate for level of  postoperative convalescence using standard cardiac rehabilitation phase  one modalities.  Overall, his status is felt to be stable for  tentative discharge in the next day or so.   CONDITION ON DISCHARGE:  Stable, improving.   DISCHARGE MEDICATIONS:  1. Lopressor 25 mg twice daily.  2. Lipitor 80 mg daily.  3. Lasix 40 mg daily for 7 days.  4. K-Dur 20 mEq daily for 7 days.  5. Protonix 40 mg daily.  6. Niferex 150 mg daily for pain.  7. Oxycodone 1-2 q.4-6 h as needed.   INSTRUCTIONS:  The patient received written instructions regarding  medications, activity, diet, wound care and follow-up.  Follow up with  Cardiology in 2 weeks.  He was instructed to call for appointment.  In  addition, he will see Dr. Dorris Fetch Apr 13, 2007, at  12:15 at the [facility]  office.   FINAL DIAGNOSIS:  Iron deficiency anemia in the setting of a gastric  ulcer, not felt to be a full explanation for his blood loss on  admission.  He will require a colonoscopy.  Note, he is instructed to  follow-up with Dr. Stan Head in 4 weeks post discharge.   OTHER DIAGNOSES:  1. Severe three-vessel coronary artery disease with non-ST segment      myocardial infarction on admission, non-Q-wave myocardial      infarction.  2. Postoperative acute blood loss anemia.  3. History of gastroesophageal reflux.  4. History of benign prostatic hyperplasia.  5. History of diverticulosis with previous flexible sigmoidoscopy in      2001.  6. History of mild hyperlipidemia.  7. Postoperative volume overload resolving.      Rowe Clack, P.A.-C.      Salvatore Decent Dorris Fetch, M.D.  Electronically Signed    WEG/MEDQ  D:  03/22/2007  T:  03/22/2007  Job:  [id number]   cc:   Salvatore Decent. Dorris Fetch, M.D.  Gordy Savers, MD  Bevelyn Buckles. Bensimhon, MD  Iva Boop, MD,FACG

## 2011-04-12 NOTE — Assessment & Plan Note (Signed)
[name]                            CARDIOLOGY OFFICE NOTE   NAME:COFFERDerwin, [name]                       MRN:          [id number]  DATE:04/02/2007                            DOB:          05-07-1926    CARDIOLOGIST:  He will be new to Rollene Rotunda, M.D.   PRIMARY CARE PHYSICIAN:  Gordy Savers, M.D.   HISTORY OF PRESENT ILLNESS:  [name] is a 77 year old male patient  who was recently admitted to [facility] March 14, 2007, with  non-ST-elevation myocardial infarction.  He was also noted to be anemic  with a hemoglobin of 8.6 and hematocrit of 25.5.  He was seen by  gastroenterology.  He was transfused with packed red blood cells.  Endoscopy did reveal an antral ulcer felt to be low risk for bleeding as  well as erosive gastritis and reflux esophagitis.  Proton pump inhibitor  therapy was recommended.  It was also recommended the patient would need  colonoscopy at a later date to better define his anemia.  After his GI  workup, he was sent for cardiac catheterization.  This revealed three-  vessel disease with a 60% left main ostial stenosis.  He was seen in  consultation by Dr. Laneta Simmers and set up for coronary artery bypass  grafting.  This was actually done by Dr. Dorris Fetch on April 21.  His  grafts included a LIMA to the LAD, vein graft to the posterior  descending, vein graft to the first diagonal, vein graft to the ramus  intermedius and obtuse marginal #1.  He had endoscopic vein harvesting  in bilateral legs.  His postoperative course was complicated by anemia  and he was transfused again.  Iron therapy was added to his regimen.  He  maintained normal sinus rhythm and was eventually discharged to home  April 25.  Of note, during his admission he did have abnormal carotid  Dopplers with total occlusion on the right and left internal carotid  artery stenosis of 60-80%.  This apparently was not felt to be in need  of  surgical repair at the time of his bypass.   The patient presents to the office today for followup.  He is doing  well.  He does note some shortness of breath with exertion but this is  not particularly troublesome for him.  He denies orthopnea or paroxysmal  nocturnal dyspnea.  He does note some chest soreness.  He does note some  left lower extremity edema.  This has been present since he left the  hospital.  There has been no change there.  He denies fevers or chills.  Denies any increase in his temperature.  Denies any cough.  He denies  any symptoms consistent with amaurosis fugax or TIAs.   CURRENT MEDICATIONS:  1. Protonix 40 mg a day.  2. Niferex 150 mg a day.  3. Aspirin 325 mg a day.  4. Lopressor 25 mg b.i.d.  5. Hydrocodone/APAP p.r.n.   ALLERGIES:  No known drug allergies.   PHYSICAL EXAMINATION:  GENERAL:  He is a well-nourished, well-developed  male in no acute distress.  VITAL SIGNS:  Blood pressure is 122/78, pulse 76, weight 179 pounds.  HEENT:  Unremarkable.  NECK:  Without JVD.  CARDIAC:  S1, S2.  Regular rate and rhythm without murmurs.  LUNGS:  Clear to auscultation bilaterally without wheeze, rhonchi or  rales.  ABDOMEN:  Soft, nontender, with normal active bowel sounds and no  organomegaly.  EXTREMITIES:  With 2+ edema on the left, no edema on the right.  There  is some mild erythema noted at the ankle.  This is not consistent with  cellulitis.  Right femoral arteriotomy site without hematoma or bruit.  Right lower extremity vein harvesting site with 2 stitches exposed - one  measuring about 3 inches.  No erythema or discharge.  NEUROLOGIC:  He is alert and oriented x3.  Cranial nerves II-XII grossly  intact.   Left lower extremity venous Dopplers in the office today revealed no  evidence of DVT.   IMPRESSION:  1. Coronary artery disease.      a.     Status post non-ST-elevation myocardial infarction in the       setting of anemia.      b.      Status post recent coronary artery bypass grafting with       grafts as noted above.  2. Preserved left ventricular function with an ejection fraction of      60% at the time of cardiac catheterization.  3. Anemia.  4. Gastroesophageal reflux disease/peptic ulcer disease.      a.     Evidence of antral ulcer, reflux esophagitis and erosive       gastritis by endoscopy.      b.     Followup colonoscopy to be arranged in the future by       gastroenterology.      c.     Followed by Dr. Leone Payor.  5. Cerebrovascular disease as outlined above with totally occluded      right internal carotid artery and 60-80% left internal carotid      artery stenosis.      a.     Carotid Dopplers will be arranged in 6 months for followup.      b.     Asymptomatic at this time.  6. Treated dyslipidemia.  7. History of benign prostatic hypertrophy.  8. History of diverticulosis.  9. Left lower extremity edema.   PLAN:  The patient presents to the office today for postoperative  followup.  We did get a chest x-ray on him in the office.  This shows  probable small left effusion.  Final results are still pending.  He also  has some left lower extremity swelling.  His ultrasound was negative for  DVT.  I have given him a prescription for Lasix 40 mg a day as well as  potassium 20 mEq a day.  We will check a BMET and a CBC today.  Will  look at the CBC to follow up on his anemia.  He has followup with Dr.  Leone Payor already arranged.  Will also get a BMET in a week to make sure  that his potassium and rental function remain stable with the addition  of Lasix.  I have asked him to keep his leg elevated as much as  possible.  On exam, he does have a couple of stitches on his right lower  extremity where his vein harvesting was done.  The stitches are quite  long, one  measuring about 3 inches.  I have left a message at Dr.  Sunday Corn office to see if he can get seen a little bit sooner than May 16.  I suspect  that these stitches need to be clipped.  I will leave  that up to Dr. Dorris Fetch.  Will also make sure he has carotid Dopplers  arranged in 6  months and will set him up with vascular surgery followup if needed at  that time.  I will have him follow up with Dr. Antoine Poche in the next 3-4  weeks.      Tereso Newcomer, PA-C  Electronically Signed      Noralyn Pick. Eden Emms, MD, [facility]  Electronically Signed   SW/MedQ  DD: 04/02/2007  DT: 04/03/2007  Job #: [id number]   cc:   Gordy Savers, MD  Salvatore Decent Dorris Fetch, M.D.  Iva Boop, MD,FACG

## 2011-04-12 NOTE — Assessment & Plan Note (Signed)
[facility] OFFICE NOTE   NAME:Ziegler, PAITON FOSCO                     MRN:          [id number]  DATE:05/14/2007                            DOB:          [birth year]    [name] who is seen today for follow-up.  He was  admitted to the hospital recently for non-ST segment elevation MI.  He  is known to have left main and severe triple vessel disease.  He has  done well, status post CABG.  He also has a history of carotid artery  stenosis and is being followed by [facility].  A follow-up carotid artery  ultrasound is scheduled in October.  He has history of gastroesophageal  reflux disease, BPH and diverticulosis.  He did have full colonoscopy  approximately three weeks ago that did reveal three small polyps.  He  has done quite well postoperatively.  He is on Lipitor, aspirin,  Protonix and metoprolol.  He has had no prior hospital admissions.   FAMILY HISTORY:  Basically unchanged, both parents died quite elderly.  Four brothers and two sisters remain in good health.   PHYSICAL EXAMINATION:  GENERAL APPEARANCE:  A well-developed, thin,  healthy appearing male who appeared younger than his stated age.  VITAL SIGNS:  Blood pressure 140/62.  HEENT:  Fundi, ears, nose and throat clear.  NECK:  No audible bruits.  CHEST:  Clear.  CARDIOVASCULAR:  A well-healed sternotomy scar.  S1 and S2 normal.  No  murmurs or gallops.  ABDOMEN:  Benign, no organomegaly.  GU:  External genitalia normal.  RECTAL:  Prostate was +2 enlarged.  Left lobe slightly more prominent.  Stool heme-negative.  EXTREMITIES:  Trace edema. Peripheral pulses were intact.  NEUROLOGIC:  Negative.   IMPRESSION:  1. Coronary artery disease.  2. Statin therapy.  3. Benign prostatic hypertrophy.  4. Gastroesophageal reflux disease.  5. Colonic polyps.   DISPOSITION:  Medical regimen unchanged.  Will reassess in three months.  A  cholesterol, SGOT and PSA will be checked today.     Gordy Savers, MD  Electronically Signed   PFK/MedQ  DD: 05/14/2007  DT: 05/14/2007  Job #: [id number]

## 2011-04-12 NOTE — Assessment & Plan Note (Signed)
[facility]                            CARDIOLOGY OFFICE NOTE   NAME:[name], [name]                     MRN:          [id number]  DATE:05/03/2007                            DOB:          1926/04/20    PRIMARY CARE PHYSICIAN:  Gordy Savers, M.D.   REASON FOR PRESENTATION:  Evaluate patient with coronary disease, status  post CABG.   HISTORY OF PRESENT ILLNESS:  The patient presents for followup of the  above.  This is my first visit with him.  He was hospitalized with  myocardial infarction and anemia.  He has coronary disease, status post  CABG, as described below.  He has done nicely since this time.  He had  some lower extremity swelling and took some Lasix.  This seems to have  improved that situation.  He is walking 30 minutes a day.  He is not  getting any of the discomfort that was his previous angina.  He has  followed up his anemia with Dr. Leone Payor.  He recently had a colonoscopy.  He has had some follow-up blood work.  He denies any chest pressure,  neck discomfort, or arm discomfort.  He has no incisional discomfort.  He has had no palpitations, presyncope, or syncope.  He has no PND or  orthopnea.   PAST MEDICAL HISTORY:  1. Coronary artery disease (status post CABG, with a LIMA to the LAD,      vein graft to PDA, vein graft to first diagonal, vein graft to      ramus intermedius and obtuse marginal 1).  2. Carotid artery stenosis (left 60%-80% stenosis).  3. Anemia (erosive gastritis and antral ulcer at the time of his      admission).  4. Gastroesophageal reflux disease.  5. Dyslipidemia.  6. Benign prostatic hypertrophy.  7. Diverticulosis.   ALLERGIES:  None.   CURRENT MEDICATIONS:  1. Lasix 40 mg daily.  2. K-Dur 20 mg daily.  3. Protonix 40 mg daily.  4. Aspirin 325 mg daily.  5. Lopressor 25 mg daily.  6. Lipitor 80 mg daily.   REVIEW OF SYSTEMS:  As stated in the HPI.  Otherwise, negative for other  systems.   PHYSICAL EXAMINATION:  GENERAL:  The patient is in no distress.  VITAL SIGNS:  Blood pressure 126/73, heart rate 56 and regular, weight  175 pounds, body mass index 27.  HEENT:  Eyelids unremarkable.  Pupils equal, round, and reactive to  light. Fundi not visualized.  Oral mucosa unremarkable.  NECK:  No jugular venous distention at 45 degrees.  Carotid upstroke  brisk and symmetric.  No bruits, no thyromegaly.  LYMPHATIC:  No cervical, axillary, or inguinal adenopathy.  LUNGS:  Clear to auscultation bilaterally.  BACK:  No costovertebral angle tenderness.  CHEST:  Well-healed sternotomy scar.  HEART:  PMI not displaced or sustained.  S1 and S2 within normal limits.  No S3, no S4, no clicks, no rubs, no murmurs.  ABDOMEN:  Flat.  Positive bowel sounds, normal in frequency and pitch.  No bruits, no rebound, no guarding, no midline pulsatile mass,  no  hepatomegaly, no splenomegaly.  SKIN:  No rashes, no nodules.  EXTREMITIES:  2+ pulses.  Mild bilateral lower extremity edema.  NEUROLOGIC:  Grossly intact throughout.   EKG:  Sinus bradycardia, rate 55, axis within normal limits, intervals  within normal limits, premature atrial contractions, old inferior  infarct, nonspecific T-wave flattening.   ASSESSMENT AND PLAN:  1. Coronary disease, status post CABG.  He is doing well.  We      respected this.  No further cardiovascular testing is suggested.      He can reduce his aspirin to 81 mg a day, especially given his GI      history.  He will remain on the other medications as listed.  2. Lower extremity swelling.  He can take his Lasix p.r.n., and I      described how to do this.  3. Dyslipidemia.  He has been given instructions to get a lipid      profile fasting when he sees Dr. Amador Cunas later this month.  4. Carotid stenosis.  He is set up to have carotid Dopplers in October      2008.  5. Followup.  I will see him back in about 6 months or sooner if       needed.     Rollene Rotunda, MD, [facility]  Electronically Signed    JH/MedQ  DD: 05/03/2007  DT: 05/04/2007  Job #: [id number]   cc:   Gordy Savers, MD

## 2011-04-12 NOTE — Assessment & Plan Note (Signed)
[facility]                         GASTROENTEROLOGY OFFICE NOTE   NAME:[name], [name]                     MRN:          [id number]  DATE:04/16/2007                            DOB:          [birth year]    CHIEF COMPLAINT:  Followup of iron-deficiency anemia.   Mr. [name] is a 77 year old white man that had an iron-deficiency anemia  without hemoccult positive stools, i.e., he was heme negative.  I saw  him in the hospital and performed an upper GI endoscopy.  He had been  admitted with chest pain and cardiac problems and needed a cardiac  catheterization as well.  He had a clean based antral ulcer, erosive  gastritis, and reflux esophagitis.  He had his coronary artery bypass  grafting because he had severe disease on his cath.  He has done well  after that.  We had planned to perform a colonoscopy to further  investigate his anemia.  Biopsies of his gastric ulcer were benign and  there was no H. pylori present.   CURRENT MEDICATIONS:  1. Protonix 40 mg daily.  2. Potassium chloride (discontinued).  3. Lopressor 25 mg b.i.d.  4. Ferrex daily.  5. Lipitor 80 mg daily.  6. Lasix 40 mg daily.  7. Aspirin 325 mg daily.   DRUG ALLERGIES:  NONE KNOWN.   PAST MEDICAL HISTORY:  1. Coronary artery disease with recent coronary artery bypass grafting      for severe left main disease as well as triple vessel disease (Dr.      Dorris [name]).  2. Gastroesophageal reflux disease.  3. Gastric ulcer.  4. Benign prostatic hypertrophy.  5. Diverticulosis on flexible sigmoidoscopy, 2001.  6. Dyslipidemia.  7. Iron-deficiency anemia, unclear etiology.   PHYSICAL EXAMINATION:  GENERAL:  Well developed, elderly, white male in  no acute distress.  VITAL SIGNS:  Show weight 175 pounds, height 6 feet 1 inch, blood  pressure 124/72, pulse regular.  LUNGS:  Clear.  There is a sternotomy scar that is healing up nicely in  the chest.  NECK:  Supple.  HEART:  S1  S2, distant.  No rubs, murmurs, or gallops.  PSYCH:  He is alert and oriented x3.   ASSESSMENT:  1. Iron-deficiency anemia.  I cannot completely attribute this to this      small to medium clean based ulcer that is benign.  2. Coronary artery disease with recent coronary artery bypass      grafting.  3. Gastroesophageal reflux disease.   PLAN:  1. Continue current medications.  2. Check CBC to follow up his anemia.  He says he still feels cold      though he does not seem to be so weak and he looks better than when      he was in the hospital.  3. Schedule colonoscopy to complete iron-deficiency anemia workup.   At this point, I am not planning on a repeat endoscopy for this ulcer,  i.e., of the stomach.  Further plans pending clinical course.     Iva Boop, MD,FACG  Electronically Signed    CEG/MedQ  DD: 04/16/2007  DT: 04/16/2007  Job #: [id number]   cc:   [name]. [name], M.D.  [name] Buckles. Bensimhon, MD

## 2011-04-15 NOTE — H&P (Signed)
NAMEBLESS, BELSHE              ACCOUNT NO.:  [id number]   MEDICAL RECORD NO.:  [id number]          PATIENT TYPE:  EMS   LOCATION:  MAJO                         FACILITY:  [facility]   PHYSICIAN:  Learta Codding, MD,FACC DATE OF BIRTH:  04/10/1926   DATE OF ADMISSION:  03/14/2007  DATE OF DISCHARGE:                              HISTORY & PHYSICAL   PRIMARY CARDIOLOGIST:  [facility].   PRIMARY CARE Odalys Win:  Gordy Savers, MD.   PATIENT PROFILE:  A 77 year old Caucasian male with no prior cardiac  history who presents with a several day history of exertional angina  with positive troponin.   PROBLEMS:  1. Non-ST-elevation myocardial infarction.  2. History of mild gastroesophageal reflux disease.  3. Benign prostatic hypertrophy.  4. History of diverticulosis with flexible sigmoidoscopy in 2001.  5. Mild hyperlipidemia not on medication.   HISTORY OF PRESENT ILLNESS:  A 77 year old married Caucasian male with  no prior history of CAD.  He is very active without limitations  normally.  Last Friday, he had a seafood dinner and awoke early the next  morning with multiple episodes of nausea and vomiting (chunks of  blackened flounder).  He denies any coffee grounds.  He felt weak all  day Saturday, Sunday and Monday and then awoke Tuesday at 3:00 a.m. with  10/10 substernal chest pressure with radiation to the left arm with  associated diaphoresis, shortness of breath lasting approximately 30  minutes and resolving spontaneously.  Eventually he fell back asleep but  when he woke up later in the day, he noted dyspnea and recurrent  exertional discomfort with any amount of exertion.  Because of these  symptoms, he presented to an Urgent Care where he told them he felt  maybe he had food poisoning from the fish that he had eaten on Friday  night and apparently they agreed and gave him a prescription for oral  Cipro therapy.  Unfortunately he has continued to have  exertional angina  with dyspnea and this prompted him to present to the [facility] ED this evening.  His ECG shows some upsloping ST-  segment depression but otherwise no acute changes and his troponin is  elevated at 0.80.  He is currently pain-free at rest.   Also of note, on this admission the patient's H&H is 8.6 and 25.5.  This  was last checked in July 2007, at which time it was 14.8 and 43.3.  His  MCV is normal at 98, as it also was in July.  The patient denies any  bright red blood per rectum, coffee-ground as above, or melena.  He has  never been told that he was anemic in the past.   ALLERGIES:  NO KNOWN DRUG ALLERGIES.   HOME MEDICATIONS:  He was recently prescribed Cipro for presumed food  poisoning.   FAMILY HISTORY:  Mother died at age 49 of old age.  Father died at age  17 of old age.  He has four brothers and two sisters, all are alive and  well without any chronic problems.   SOCIAL  HISTORY:  He lives in [place], [place], with his wife.  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  He has three grown sons.  He  denies any tobacco, alcohol or drug use.   REVIEW OF SYSTEMS:  Positive for chest pain, shortness of breath,  dyspnea on exertion, chills, diaphoresis, weakness, nausea and vomiting  as outlined in HPI.  All other systems were reviewed and negative.   PHYSICAL EXAMINATION:  VITAL SIGNS:  Temperature 98.4, heart rate 80,  respirations 20, blood pressure 144/73, pulse ox 98% on 2 liters.  GENERAL APPEARANCE:  Pleasant white male in no acute distress.  Awake,  alert and oriented x3.  HEENT:  Normal.  SKIN:  Warm and dry without lesions or masses.  LUNGS:  Respirations regular and unlabored.  Clear to auscultation.   NECK:  No bruits or JVD.  CARDIAC:  Regular S1 and S2, no S3, S4 or murmurs.  His heart sounds are  distant.  ABDOMEN:  Round, soft, nontender and nondistended.  Bowel sounds present  x4.  EXTREMITIES:  Warm, dry, pink.  No clubbing, cyanosis or edema.  Dorsalis pedis and posterior tibial pulses 2+ and equal bilaterally.  RECTAL:  There was no stool in the vault and his secretions were  negative for fecal occult blood.  He did have some external hemorrhoids  without any bleeding.   Chest x-ray from today shows no acute cardiopulmonary findings.   EKG shows sinus rhythm, rate of 75 and normal axis, less than 1 mm  upsloping ST depression in leads I, II, aVL, V3-V6.   LABORATORY DATA:  Hemoglobin 8.6, hematocrit 25.5, WBC 9.1, platelets  227 (June 14, 2006, H&H was 14.8 and 43.3 with an MCV of 98.9.), MCV  today 98.  Sodium 139, potassium 3.7. Chloride 105, CO2 26.6, BUN 15,  creatinine 0.2, glucose 114.  CK-MB 6.4, troponin-I 0.80.   ASSESSMENT/PLAN:  1. Non-ST-elevation myocardial infarction.  The patient gives classic      story for angina with elevated troponin at 0.80 in the setting of      newly found anemia with a hemoglobin and hematocrit of 8.6 and      25.5.  Rectal exam revealed no stool in the vault, but secretions      are negative for fecal occult blood.  I discussed the case with Dr.      Andee Lineman.  Will plan to admit and cycle cardiac enzymes.  We will use      aspirin only for the time being in light of anemia and fear of      bleeding.  Will add a Statin.  We will avoid beta blocker at this      point with anemia and potential for orthostasis.  Type and cross 2      units and plan to transfuse.  Will need GI evaluation in the a.m.      (this will need to be called).  Will obtain 2-D echocardiogram to      evaluate LV function and wall motions and plan on cardiac     catheterization once anemia picture is more clear.  2. Normocytic anemia, question acute bleed.  He had  vomiting on  Saturday but denies any cough grounds.      Denies any melena, no bright red blood.  Plan to guaiac stools.      Will check iron studies, B12, folate, reticulocytes and then      transfuse.  Will add proton pump inhibitor.  3. Lipid status.  Prior history of mild hyperlipidemia.  Will add      Lipitor 80.  Check LFTs.      Nicolasa Ducking, ANP      Learta Codding, MD,FACC  Electronically Signed    CB/MEDQ  D:  03/14/2007  T:  03/15/2007  Job:  [id number]

## 2011-04-15 NOTE — Consult Note (Signed)
[name], [name]              ACCOUNT NO.:  [id number]   MEDICAL RECORD NO.:  [id number]          PATIENT TYPE:  INP   LOCATION:  2399                         FACILITY:  [facility]   PHYSICIAN:  Evelene Croon, M.D.     DATE OF BIRTH:  1926-05-28   DATE OF CONSULTATION:  03/18/2007  DATE OF DISCHARGE:                                 CONSULTATION   REFERRING PHYSICIAN:  Dr. Doylene Canning. Ladona Ridgel.   REASON FOR CONSULTATION:  High-grade left main and severe three-vessel  coronary disease, status post non-ST-segment elevation MI.   HISTORY OF PRESENT ILLNESS:  I was called today to evaluate [name],  for consideration of coronary bypass graft surgery by Dr. Lewayne Bunting.  The patient is a 77 year old gentleman with no prior cardiac history who  has never been in the hospital before and has been very active and doing  quite well until last Saturday.  On Friday of last week, he ate a  seafood dinner and awoke early Saturday morning with multiple episodes  of nausea and vomiting.  He said he felt weak all day on Saturday and  Sunday.  On Monday, he did not seem to improve and then on Tuesday,  about 3 a.m., he developed 10/10 substernal chest pain radiating to his  left arm associated with shortness of breath and diaphoresis.  This  lasted about 30 minutes and then improved spontaneously.  He continued  to have exertional chest pain and shortness of breath with any activity  and therefore went to an Urgent Care Center.  He was given Cipro for  possible food poisoning and continued to have symptoms and therefore,  came to the emergency room.  He ruled in for a non-ST-segment elevation  MI with a peak CPK of 69.4 and MB of 6.4.  His troponin I was initially  0.8 and increased to a maximum of 1.71 before decreasing.  He was also  noted to be anemic on admission with hemoglobin of 8.8 and hematocrit of  26.  Since admission, he has had no chest pain or shortness of breath.  He underwent evaluation  by GI medicine and subsequent upper endoscopy  was performed on March 16, 2007.  This showed a clean-based, small  antral ulcer and was felt to be at low risk of bleeding as well as some  erosive gastritis and reflux esophagitis.  It was felt by GI medicine  that this probably did not account for his anemia and that he should  have colonoscopy at some point.  The patient underwent cardiac  catheterization on March 16, 2007, which showed high-grade left main and  three-vessel disease.  The cardiac catheterization note reported a 60%  ostial left main stenosis, but on my review, I think it is tighter than  that and probably closer to 90%.  The left main is calcified.  The LAD  had a 90% stenosis after the last septal perforator and a 99% stenosis  after the diagonal branch.  There is a diagonal that had about 80%  stenosis.  The left circumflex gave off a moderate-sized intermediate  branch that had proximal 80% stenosis and a first marginal had 80%  proximal stenosis.  The right coronary was dominant with proximal 90%  stenosis.  The posterior descending had about 80% ostial stenosis.  Left  ventricular function was normal with ejection fraction about 60%.  There  was no gradient across the aortic valve.  He has remained free of chest  pain and shortness of breath.   REVIEW OF SYSTEMS:  GENERAL:  He denies any fever or chills.  He did  have some sweating since last Saturday morning.  He has had no weight  loss.  He denies any prior fatigue or weakness, but has had marked  fatigue and weakness since last Saturday.  HEENT:  He has had some  blurred vision over the past 6 months or so.  He was evaluated and told  that he had glaucoma and has been treated for that.  ENT:  Negative.  ENDOCRINE:  He denies diabetes and hypothyroidism.  CARDIOVASCULAR:  As  above.  He denies PND or orthopnea.  He has had no peripheral edema or  palpitations.  RESPIRATORY:  Denies cough and sputum production.   GI:  He has had no prior history of ulcer disease.  He has had  gastroesophageal reflux symptoms in the past.  He did have some nausea,  vomiting  last Friday, but none before that.  He denies ever having  melena or bright red blood per rectum, but says that he really never  looks.  GU:  He denies dysuria and hematuria.  MUSCULOSKELETAL:  He  denies arthralgias and myalgias.  NEUROLOGICAL:  He denies any focal  weakness or numbness.  Denies dizziness and syncope.  He has never had a  TIA or stroke.  ALLERGIES:  None.  PSYCHIATRIC:  Negative.  HEMATOLOGY:  Negative.   MEDICATIONS:  His only medication was some Cipro that he was started on  for possible food poisoning.   PAST MEDICAL HISTORY:  1. Hyperlipidemia.  2. History of BPH.  3. History of gastroesophageal reflux.  4. Diverticulosis by flexible sigmoidoscopy in 2001.   SOCIAL HISTORY:  He lives in [place] with his second wife.  His first  wife died of heart disease in her 66s.  He has three grown sons, one of  whom has had coronary bypass surgery in his 89s.  He has never smoked.  Denies alcohol abuse.  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   FAMILY HISTORY:  His father died age 76 and had no history of heart  disease.  Mother died at 19 with no history of heart disease.  He has  four brothers and two sisters all who are alive and well with no history  of heart disease.  As mentioned above, his first wife had heart disease  and died in her 69s.  He has one son who had heart disease and bypass  surgery in his 17s.   PHYSICAL EXAMINATION:  VITAL SIGNS:  His blood pressure is 114/62, pulse  62 and regular, respiratory rate is 18 and unlabored.  GENERAL:  He is a well-developed, white male in no distress.  HEENT:  Normocephalic and atraumatic.  Pupils are equal and reactive to  light accommodation.  Extraocular muscles are intact.  Throat is clear. NECK:  Normal carotid pulses bilaterally.   There are no bruits.  There  is no adenopathy or thyromegaly.  CARDIAC:  Regular rate and rhythm with normal  S1-S2.  There is no  murmur, rub or gallop.  LUNGS:  Clear.  ABDOMEN:  Active bowel sounds.  Soft and nontender.  No palpable masses  or organomegaly.  EXTREMITIES:  No peripheral edema.  Pedal pulses are palpable  bilaterally.  SKIN:  Skin is warm and dry.  NEUROLOGIC:  Alert and oriented x3.  Motor and sensory exams grossly  normal.   LABORATORY DATA AND X-RAY FINDINGS:  Laboratory examination, as  mentioned above, showed a hemoglobin of 8.6, hematocrit 25.5 on  admission.  He did receive 2 units of blood transfusion with hemoglobin  today of 10.3, hematocrit of 29.4, platelet count is 229,000, white  blood cell count 7.4.  Electrolytes were normal with a BUN of 38,  creatinine of 1.1.  His GFR is greater than 60.   Electrocardiogram shows sinus bradycardia with a first-degree AV block  and possible inferior infarct, age undetermined.   IMPRESSION/RECOMMENDATIONS:  Mr. Bobrowski has high-grade left main and  severe three-vessel coronary disease presenting with non-ST-segment  elevation MI and anemia.  He has no evidence of active upper GI  bleeding.  I think we should proceed with coronary bypass graft surgery  tomorrow morning.  I discussed the operative procedure with him  including alternatives, benefits and risks including, but not limited  to, bleeding, blood transfusion, infection, stroke, myocardial  infarction, graft failure and death.  I also told him that Dr.  Dorris Fetch  would be the one doing his surgery since I already have surgery  scheduled tomorrow and Dr. Dorris Fetch has time to do it.  I do not  think his surgery should wait.  He is full agreement with that and will  proceed in the morning.  We will obtain preoperative arterial Dopplers  either tonight or tomorrow morning before surgery.      Evelene Croon, M.D.  Electronically Signed     BB/MEDQ   D:  03/18/2007  T:  03/19/2007  Job:  [id number]   cc:   Gordy Savers, MD

## 2011-04-15 NOTE — Cardiovascular Report (Signed)
NAMEBINYAMIN, [name] NO.:  [id number]   MEDICAL RECORD NO.:  [id number]          PATIENT TYPE:  INP   LOCATION:  3701                         FACILITY:  [facility]   PHYSICIAN:  Rollene Rotunda, MD, FACCDATE OF BIRTH:  [birth year]   DATE OF PROCEDURE:  03/16/2007  DATE OF DISCHARGE:                            CARDIAC CATHETERIZATION   The primary is Gordy Savers, MD.   PROCEDURE:  Left heart catheterization/coronary arteriography.   INDICATIONS:  Evaluate patient with a non-Q-wave myocardial infarction.   PROCEDURAL NOTE:  Left heart catheterization performed via the right  femoral artery.  The artery was cannulated using an anterior wall  puncture.  A #6-French arterial sheath was inserted via the modified  Seldinger technique.  Preformed Judkins and a pigtail catheter utilized.  The patient tolerated the procedure well and left the lab in stable  condition.   RESULTS:   HEMODYNAMICS:  LV 139/21, AO 135/81.   CORONARIES:  The left main had ostial 60% stenosis.  There was a damping  of the pressure waveform with catheter engagement.  The LAD had a mid  90% stenosis after the first septal perforator but before the first  diagonal.  There was 99% stenosis after the diagonal.  The diagonal was  a moderate-sized vessel with proximal aneurysmal dilatation and 80%  stenosis.  The circumflex in the AV groove had luminal irregularities.  The ramus intermediate was a moderate-sized though narrow-caliber  vessel.  It had a proximal 80% stenosis.  OM-1 was moderate-sized with  proximal 80% stenosis.  The right coronary artery was a large, dominant  vessel.  There there proximal tandem 80-90% lesions.  The PDA was  moderate to small with ostial 80% stenosis.  There were two small  posterolaterals, which were free of high-grade disease.   LEFT VENTRICULOGRAM:  A left ventriculogram was obtained in the RAO  projection.  The EF was 60% with normal wall motion.    CONCLUSION:  1. Severe three-vessel coronary artery disease.  2. Well-preserved left ventricular function.   PLAN:  We will obtain a CVTS consult for consideration of bypass.      Rollene Rotunda, MD, [facility]  Electronically Signed     JH/MEDQ  D:  03/16/2007  T:  03/16/2007  Job:  [id number]   cc:   Gordy Savers, MD

## 2011-04-15 NOTE — Op Note (Signed)
NAMEPABLO, STAUFFER              ACCOUNT NO.:  [id number]   MEDICAL RECORD NO.:  [id number]          PATIENT TYPE:  INP   LOCATION:  2309                         FACILITY:  [facility]   PHYSICIAN:  Salvatore Decent. Dorris Fetch, M.D.DATE OF BIRTH:  Jun 11, 1926   DATE OF PROCEDURE:  03/19/2007  DATE OF DISCHARGE:                               OPERATIVE REPORT   PREOPERATIVE DIAGNOSIS:  Left main and three-vessel coronary disease  status post non-Q-wave myocardial infarction.   POSTOPERATIVE DIAGNOSIS:  Left main and three-vessel coronary disease  status post non-Q-wave myocardial infarction.   PROCEDURE:  Median sternotomy, extracorporeal circulation, coronary  artery bypass grafting times five (left internal mammary artery to left  anterior descending, saphenous vein graft to posterior descending,  composite saphenous vein graft to first diagonal, sequential saphenous  vein graft to ramus intermedius and obtuse marginal one), endoscopic  vein harvest both legs.   SURGEON:  Salvatore Decent. Dorris Fetch, M.D.   ASSISTANT:  Coral Ceo, P.A.   ANESTHESIA:  General.   FINDINGS:  Posterior descending poor quality target, remaining  coronaries, good-quality targets.  Left internal mammary artery good-  quality. Only a small portion of the saphenous vein from right leg  usable as a composite vein to the diagonal, saphenous vein from left leg  fair quality.   CLINICAL NOTE:  [name] is a 77 year old gentleman who presented with  anemia and non-Q-wave myocardial infarction.  He underwent a  gastrointestinal workup including upper endoscopy which revealed an  ulcer.  Because he ruled in for MI he underwent cardiac catheterization  where he was found to have left main and three-vessel coronary disease.  He was referred for coronary artery bypass grafting.  He was seen in  consultation by Dr. Laneta Simmers who advised coronary artery bypass grafting.  The patient was scheduled for the first available  operating slot due to  his tight left main disease which was the morning of 03/19/2007.  I met  with the patient in the preoperative holding area.  He was aware of the  indications, risks, benefits and alternatives and wished to proceed with  surgery.   OPERATIVE NOTE:  [name] was brought to the preop holding area on  03/19/2007.  Lines were placed for monitoring arterial, central venous  and pulmonary arterial pressure.  Intravenous antibiotics were  administered.  He was taken to the operating room, anesthetized and  intubated.  A Foley catheter was placed.  The chest, abdomen and legs  were prepped and draped in usual fashion.   A median sternotomy was performed and the left internal mammary artery  was harvested in standard fashion.  Simultaneously an incision was made  in the medial aspect of the right leg at the level of the knee and the  greater saphenous vein was harvested from the groin to the mid calf  endoscopically.  Five thousand units of heparin was administered during  the vessel harvest.  After removing the right greater saphenous vein  from the leg it was obvious that the portion below the knee was far too  small to use as a bypass graft.  The portion above the knee, there were  multiple segments of sclerotic vein.  There were two segments of  essentially normal sized and quality vein which were anastomosed  together to create a composite vein for grafting the diagonal.  The vein  then was harvested from the left leg endoscopically from the groin to  the mid calf.  The left greater saphenous vein was of fair quality, far  superior to the right greater saphenous vein.   The pericardium was opened.  The ascending aorta was inspected.  There  was no evidence of atherosclerotic disease.  The aorta was cannulated  via concentric two Ethilon pledgeted pursestring sutures.  A dual stage  venous cannula was placed through a pursestring suture in the right  atrial  appendage.  Cardiopulmonary bypass was instituted and the patient  was cooled to 32 degrees Celsius.  The coronary arteries were inspected  and anastomotic sites were chosen.  The conduits were inspected and cut  to length.  A foam pad was placed in the pericardium to save the left  phrenic nerve.  A temperature probe was placed in the myocardial septum.  A cardioplegic cannula was placed in the ascending aorta.   The aorta was crossclamped.  The left ventricle was emptied via the  aortic root vent.  Cardiac arrest was achieved with a combination of  cold antegrade blood cardioplegia and topical iced saline.  1500 mL of  cardioplegia was administered.  The myocardial septal temperature fell  to 10 degrees Celsius.  The following distal anastomoses were performed.   First a reverse saphenous vein graft was placed end-to-side to the  posterior descending branch of the right coronary.  This vessel had a  significant stenosis in its mid portion and had to be grafted distally.  It was a 1 mm poor quality target vessel.  The vein graft was of  relatively small caliber. It was anastomosed end-to-side with a running  7-0 Prolene suture.  The anastomosis did probe both proximally and  distally with a 1 mm probe at its completion.  At the completion of each  vein graft anastomosis, cardioplegia was administered down the vein to  inspect for flow and hemostasis.   Next a reverse saphenous vein graft was placed end-to-side to the first  diagonal branch of the LAD.  This was the composite vein from the right  leg.  It was anastomosed end-to-side with a running 7-0 Prolene suture.  The diagonal was a 1.5-mm good-quality target which was heavily diseased  proximally.   Next a reverse saphenous vein graft was placed sequentially to the ramus  intermedius and obtuse marginal one.  This segment of vein was from the left thigh.  It was anastomosed side-to-side to the ramus intermedius  and end-to-side  to obtuse marginal one.  Both of these vessels were 1.5  mm vessels at the site of the anastomosis, both had 80% stenoses  proximally.   Next the left internal mammary artery was brought through a window in  the pericardium.  The distal end was beveled and was anastomosed end-to-  side to the distal LAD.  The LAD was grafted distally towards the apex.  There was disease distal to the anastomosis but a 1.5-mm probe did pass  to the apex as well as retrograde from the site of the anastomosis.  The  mammary was a 2-mm good-quality vessel that was anastomosed end-to-side  with running 8-0 Prolene suture.  At the completion of the mammary  to  LAD anastomosis the bulldog clamp was briefly removed from the mammary  artery.  Immediate and rapid septal rewarming was noted.  The bulldog  clamp was placed.  The mammary pedicle was tacked to the epicardial  surface of the heart with 6-0 Prolene sutures.   Additional cardioplegia was administered.  The vein grafts were cut to  length.  Cardioplegia cannulas removed from the ascending aorta.  The  proximal vein graft anastomoses were performed with 4.5 mm punch  aortotomies with running 6-0 Prolene sutures while under crossclamp.   At the completion of the final proximal anastomoses the patient was  placed in Trendelenburg position.  Lidocaine was administered.  The  bulldog clamp was again removed from the left mammary artery.  The  aortic root was de-aired and aortic crossclamp was removed.  The total  crossclamp time was 82 minutes.   The patient converted to sinus bradycardia spontaneously and did not  require defibrillation.  While the patient was being rewarmed all  proximal and distal anastomoses were inspected for hemostasis.  Epicardial pacing wires were placed on the right ventricle and right  atrium.  When the patient had rewarmed to a core temperature of 37  degrees Celsius he was weaned from cardiopulmonary bypass on the first   attempt without difficulty.  He was DDD paced for rate and on no  inotropic support at the time of separation from bypass.  Initial  cardiac index was greater than 2 liters per minute per meter squared.  The patient remained hemodynamically stable throughout the post bypass  period.   A test dose of protamine was administered and was well tolerated.  The  atrial and aortic cannulae were removed.  The remainder of the protamine  was administered without incident.  The chest was irrigated with 1 liter  of warm normal saline containing 1 gram of vancomycin.  Hemostasis was  achieved.  The pericardium was reapproximated with interrupted 3-0 silk  sutures.  It came together easily without tension.  A left pleural and  two mediastinal chest tubes were placed through separate subcostal  incisions.  The sternum was closed with a combination of single and double heavy gauge stainless steel wires.  Pectoralis fascia,  subcutaneous tissue and skin were closed in separate layers in the  standard fashion.  All sponge, needle and instrument counts were correct  at the end of the procedure.  There were no intraoperative  complications.  The patient was taken from the operating room to the  surgical intensive care unit in critical but stable condition.      Salvatore Decent Dorris Fetch, M.D.  Electronically Signed     SCH/MEDQ  D:  03/19/2007  T:  03/20/2007  Job:  [id number]   cc:   Bevelyn Buckles. Bensimhon, MD  Gordy Savers, MD  Iva Boop, MD,FACG

## 2011-08-26 ENCOUNTER — Encounter: Payer: Self-pay | Admitting: Internal Medicine

## 2011-09-09 ENCOUNTER — Ambulatory Visit (INDEPENDENT_AMBULATORY_CARE_PROVIDER_SITE_OTHER): Payer: Medicare Other | Admitting: Internal Medicine

## 2011-09-09 ENCOUNTER — Encounter: Payer: Self-pay | Admitting: Internal Medicine

## 2011-09-09 DIAGNOSIS — I2510 Atherosclerotic heart disease of native coronary artery without angina pectoris: Secondary | ICD-10-CM

## 2011-09-09 DIAGNOSIS — E785 Hyperlipidemia, unspecified: Secondary | ICD-10-CM

## 2011-09-09 DIAGNOSIS — Z0000 Encounter for general adult medical examination without abnormal findings: Secondary | ICD-10-CM

## 2011-09-09 DIAGNOSIS — K219 Gastro-esophageal reflux disease without esophagitis: Secondary | ICD-10-CM

## 2011-09-09 DIAGNOSIS — Z23 Encounter for immunization: Secondary | ICD-10-CM

## 2011-09-09 LAB — LIPID PANEL
Cholesterol: 195 mg/dL (ref 0–200)
LDL Cholesterol: 115 mg/dL — ABNORMAL HIGH (ref 0–99)
Total CHOL/HDL Ratio: 3
VLDL: 15.8 mg/dL (ref 0.0–40.0)

## 2011-09-09 LAB — CBC WITH DIFFERENTIAL/PLATELET
Basophils Absolute: 0 10*3/uL (ref 0.0–0.1)
Basophils Relative: 0.6 % (ref 0.0–3.0)
Eosinophils Absolute: 0 10*3/uL (ref 0.0–0.7)
Lymphocytes Relative: 33.7 % (ref 12.0–46.0)
MCHC: 33.2 g/dL (ref 30.0–36.0)
MCV: 101.3 fl — ABNORMAL HIGH (ref 78.0–100.0)
Monocytes Absolute: 0.6 10*3/uL (ref 0.1–1.0)
Neutrophils Relative %: 57.5 % (ref 43.0–77.0)
RBC: 4.19 Mil/uL — ABNORMAL LOW (ref 4.22–5.81)
RDW: 13.3 % (ref 11.5–14.6)

## 2011-09-09 LAB — COMPREHENSIVE METABOLIC PANEL
ALT: 17 U/L (ref 0–53)
AST: 24 U/L (ref 0–37)
Albumin: 4.6 g/dL (ref 3.5–5.2)
Alkaline Phosphatase: 72 U/L (ref 39–117)
BUN: 15 mg/dL (ref 6–23)
Calcium: 9.7 mg/dL (ref 8.4–10.5)
Chloride: 105 mEq/L (ref 96–112)
Potassium: 5.3 mEq/L — ABNORMAL HIGH (ref 3.5–5.1)
Sodium: 142 mEq/L (ref 135–145)
Total Protein: 7.6 g/dL (ref 6.0–8.3)

## 2011-09-09 MED ORDER — ATORVASTATIN CALCIUM 80 MG PO TABS: 80.0000 mg | ORAL_TABLET | Freq: Every day | ORAL | Status: DC

## 2011-09-09 MED ORDER — SILDENAFIL CITRATE 100 MG PO TABS: 100.0000 mg | ORAL_TABLET | Freq: Every day | ORAL | Status: DC | PRN

## 2011-09-09 NOTE — Progress Notes (Signed)
Subjective:    Patient ID: [name], male    DOB: [birth year], 77 y.o.   MRN: [id number]  HPI  CC: cpx - doing well.   History of Present Illness:   77 year-old patient who is in today for a comprehensive evaluation. He has a history of coronary artery disease and is status post coronary artery bypass grafting. He has osteoarthritis, which is fairly mild. Additionally, he has treated dyslipidemia, and a history of BPH. There is diverticulosis and mild gastroesophageal reflux disease. He has remote history of colonic polyps.   Here for Medicare AWV:   1. Risk factors based on Past M, S, F history: patient has known coronary artery disease, status post CABG. Continues aggressive risk factor modification  2. Physical Activities: no limitations quite active  3. Depression/mood: history of depression or mood disorder  4. Hearing: no significant impairment  5. ADL's: independent in all aspects of daily living  6. Fall Risk: low  7. Home Safety: no problems identified  8. Height, weight, &visual acuity:height and weight stable. No difficulty with visual acuity. He states that he does not even require reading glasses  9. Counseling: heart healthy diet regular exercise. All encouraged  10. Labs ordered based on risk factors: laboratory studies, including lipid profile will review  11. Referral Coordination-none appropriate at this time  12. Care Plan- heart healthy diet and regular. Exercise regimen will be continued. Will continue aggressive risk factor modification. That includes aspirin and Lipitor  13. Cognitive Assessment- alert and oriented, with normal affect. No cognitive dysfunction. Continues to handle all his executive functions without difficulty   Allergies (verified):  No Known Drug Allergies   Past History:  Past Medical History:  Reviewed history from 05/13/2008 and no changes required.  Colonic polyps, hx of  Diverticulosis, colon  GERD  Hyperlipidemia  Benign  prostatic hypertrophy  Coronary artery disease status post non-Q-wave MI, April 2008; status post CABG  antral ulcer, April 2008  ED   Past Surgical History:  Reviewed history from 08/14/2009 and no changes required.  status post CABG April 2008  colonoscopy May 2009  Cataract extraction 2009   Family History:  Reviewed history from 08/14/2009 and no changes required.  both parents living to the 90s  he has 4 brothers two sisters- all living  no family history of cancer   Social History:  Reviewed history from 08/14/2009 and no changes required.  Married  Regular exercise-yes     Review of Systems  Constitutional: Negative for fever, chills, activity change, appetite change and fatigue.  HENT: Negative for hearing loss, ear pain, congestion, rhinorrhea, sneezing, mouth sores, trouble swallowing, neck pain, neck stiffness, dental problem, voice change, sinus pressure and tinnitus.   Eyes: Negative for photophobia, pain, redness and visual disturbance.  Respiratory: Negative for apnea, cough, choking, chest tightness, shortness of breath and wheezing.   Cardiovascular: Negative for chest pain, palpitations and leg swelling.  Gastrointestinal: Negative for nausea, vomiting, abdominal pain, diarrhea, constipation, blood in stool, abdominal distention, anal bleeding and rectal pain.  Genitourinary: Negative for dysuria, urgency, frequency, hematuria, flank pain, decreased urine volume, discharge, penile swelling, scrotal swelling, difficulty urinating, genital sores and testicular pain.  Musculoskeletal: Negative for myalgias, back pain, joint swelling, arthralgias and gait problem.  Skin: Negative for color change, rash and wound.  Neurological: Negative for dizziness, tremors, seizures, syncope, facial asymmetry, speech difficulty, weakness, light-headedness, numbness and headaches.  Hematological: Negative for adenopathy. Does not bruise/bleed easily.  Psychiatric/Behavioral:  Negative  for suicidal ideas, hallucinations, behavioral problems, confusion, sleep disturbance, self-injury, dysphoric mood, decreased concentration and agitation. The patient is not nervous/anxious.        Objective:   Physical Exam  Constitutional: He appears well-developed and well-nourished.  HENT:  Head: Normocephalic and atraumatic.  Right Ear: External ear normal.  Left Ear: External ear normal.  Nose: Nose normal.  Mouth/Throat: Oropharynx is clear and moist.  Eyes: Conjunctivae and EOM are normal. Pupils are equal, round, and reactive to light. No scleral icterus.  Neck: Normal range of motion. Neck supple. No JVD present. No thyromegaly present.  Cardiovascular: Regular rhythm, normal heart sounds and intact distal pulses.  Exam reveals no gallop and no friction rub.   No murmur heard. Pulmonary/Chest: Effort normal and breath sounds normal. He exhibits no tenderness.  Abdominal: Soft. Bowel sounds are normal. He exhibits no distension and no mass. There is no tenderness.  Genitourinary: Prostate normal and penis normal.  Musculoskeletal: Normal range of motion. He exhibits no edema and no tenderness.  Lymphadenopathy:    He has no cervical adenopathy.  Neurological: He is alert. He has normal reflexes. No cranial nerve deficit. Coordination normal.  Skin: Skin is warm and dry. No rash noted.  Psychiatric: He has a normal mood and affect. His behavior is normal.          Assessment & Plan:    Preventive Health  Examination  CAD.stable Dyslipiedemia BPH

## 2011-09-09 NOTE — Patient Instructions (Signed)
It is important that you exercise regularly, at least 20 minutes 3 to 4 times per week.  If you develop chest pain or shortness of breath seek  medical attention.  Limit your sodium (Salt) intake  Return in one year for follow-up  

## 2012-02-22 ENCOUNTER — Encounter: Payer: Self-pay | Admitting: Internal Medicine

## 2012-09-14 ENCOUNTER — Encounter: Payer: Medicare Other | Admitting: Internal Medicine

## 2012-09-26 ENCOUNTER — Encounter: Payer: Self-pay | Admitting: Internal Medicine

## 2012-09-26 ENCOUNTER — Ambulatory Visit (INDEPENDENT_AMBULATORY_CARE_PROVIDER_SITE_OTHER): Payer: Medicare Other | Admitting: Internal Medicine

## 2012-09-26 VITALS — BP 110/84 | HR 68 | Temp 97.8°F | Resp 20 | Ht 72.0 in | Wt 163.0 lb

## 2012-09-26 DIAGNOSIS — K219 Gastro-esophageal reflux disease without esophagitis: Secondary | ICD-10-CM

## 2012-09-26 DIAGNOSIS — I2510 Atherosclerotic heart disease of native coronary artery without angina pectoris: Secondary | ICD-10-CM

## 2012-09-26 DIAGNOSIS — E039 Hypothyroidism, unspecified: Secondary | ICD-10-CM

## 2012-09-26 DIAGNOSIS — E785 Hyperlipidemia, unspecified: Secondary | ICD-10-CM

## 2012-09-26 DIAGNOSIS — Z86010 Personal history of colonic polyps: Secondary | ICD-10-CM

## 2012-09-26 DIAGNOSIS — Z0000 Encounter for general adult medical examination without abnormal findings: Secondary | ICD-10-CM

## 2012-09-26 DIAGNOSIS — Z23 Encounter for immunization: Secondary | ICD-10-CM

## 2012-09-26 LAB — CBC WITH DIFFERENTIAL/PLATELET
Basophils Absolute: 0 10*3/uL (ref 0.0–0.1)
HCT: 42.8 % (ref 39.0–52.0)
Lymphs Abs: 1.9 10*3/uL (ref 0.7–4.0)
Monocytes Relative: 6.2 % (ref 3.0–12.0)
Platelets: 189 10*3/uL (ref 150.0–400.0)
RDW: 12.9 % (ref 11.5–14.6)

## 2012-09-26 LAB — LIPID PANEL
Cholesterol: 197 mg/dL (ref 0–200)
LDL Cholesterol: 126 mg/dL — ABNORMAL HIGH (ref 0–99)
Total CHOL/HDL Ratio: 4
Triglycerides: 84 mg/dL (ref 0.0–149.0)

## 2012-09-26 LAB — COMPREHENSIVE METABOLIC PANEL
ALT: 15 U/L (ref 0–53)
Albumin: 4.1 g/dL (ref 3.5–5.2)
CO2: 29 mEq/L (ref 19–32)
Calcium: 9.6 mg/dL (ref 8.4–10.5)
Chloride: 105 mEq/L (ref 96–112)
GFR: 69.99 mL/min (ref >60.00)
Potassium: 5.2 mEq/L — ABNORMAL HIGH (ref 3.5–5.1)
Sodium: 141 mEq/L (ref 135–145)
Total Protein: 7.4 g/dL (ref 6.0–8.3)

## 2012-09-26 MED ORDER — ATORVASTATIN CALCIUM 80 MG PO TABS: 80.0000 mg | ORAL_TABLET | Freq: Every day | ORAL | Status: AC

## 2012-09-26 MED ORDER — ASPIRIN 81 MG PO TBEC: 81.0000 mg | DELAYED_RELEASE_TABLET | Freq: Every day | ORAL | Status: AC

## 2012-09-26 NOTE — Progress Notes (Signed)
Patient ID: [name], male   DOB: 09-04-1926, 78 y.o.   MRN: [id number]  Subjective:    Patient ID: [name], male    DOB: [birth year], 78 y.o.   MRN: [id number]  HPI  CC: cpx - doing well.   History of Present Illness:   78 year-old patient who is in today for a comprehensive evaluation. He has a history of coronary artery disease and is status post coronary artery bypass grafting. He has osteoarthritis, which is fairly mild. Additionally, he has treated dyslipidemia, and a history of BPH. There is diverticulosis and mild gastroesophageal reflux disease. He has remote history of colonic polyps.   Here for Medicare AWV:   1. Risk factors based on Past M, S, F history: patient has known coronary artery disease, status post CABG. Continues aggressive risk factor modification  2. Physical Activities: no limitations quite active  3. Depression/mood: history of depression or mood disorder  4. Hearing: no significant impairment  5. ADL's: independent in all aspects of daily living  6. Fall Risk: low  7. Home Safety: no problems identified  8. Height, weight, &visual acuity:height and weight stable. No difficulty with visual acuity. He states that he does not even require reading glasses  9. Counseling: heart healthy diet regular exercise. All encouraged  10. Labs ordered based on risk factors: laboratory studies, including lipid profile will review  11. Referral Coordination-none appropriate at this time  12. Care Plan- heart healthy diet and regular. Exercise regimen will be continued. Will continue aggressive risk factor modification. That includes aspirin and Lipitor  13. Cognitive Assessment- alert and oriented, with normal affect. No cognitive dysfunction. Continues to handle all his executive functions without difficulty   Allergies (verified):  No Known Drug Allergies   Past History:  Past Medical History:  Reviewed history from 05/13/2008 and no changes required.    Colonic polyps, hx of  Diverticulosis, colon  GERD  Hyperlipidemia  Benign prostatic hypertrophy  Coronary artery disease status post non-Q-wave MI, April 2008; status post CABG  antral ulcer, April 2008  ED   Past Surgical History:  Reviewed history from 08/14/2009 and no changes required.  status post CABG April 2008  colonoscopy May 2009  Cataract extraction 2009  Status post inguinal hernia repair December 2012  Family History:  Reviewed history from 08/14/2009 and no changes required.  both parents living to the 90s  he has 4 brothers two sisters- all living  no family history of cancer   Social History:  Reviewed history from 08/14/2009 and no changes required.  Widowed- spends most of his time at [facility] Regular exercise-yes     Review of Systems  Constitutional: Negative for fever, chills, activity change, appetite change and fatigue.  HENT: Negative for hearing loss, ear pain, congestion, rhinorrhea, sneezing, mouth sores, trouble swallowing, neck pain, neck stiffness, dental problem, voice change, sinus pressure and tinnitus.   Eyes: Negative for photophobia, pain, redness and visual disturbance.  Respiratory: Negative for apnea, cough, choking, chest tightness, shortness of breath and wheezing.   Cardiovascular: Negative for chest pain, palpitations and leg swelling.  Gastrointestinal: Negative for nausea, vomiting, abdominal pain, diarrhea, constipation, blood in stool, abdominal distention, anal bleeding and rectal pain.  Genitourinary: Negative for dysuria, urgency, frequency, hematuria, flank pain, decreased urine volume, discharge, penile swelling, scrotal swelling, difficulty urinating, genital sores and testicular pain.  Musculoskeletal: Negative for myalgias, back pain, joint swelling, arthralgias and gait problem.  Skin: Negative for color change, rash  and wound.  Neurological: Negative for dizziness, tremors, seizures, syncope, facial asymmetry,  speech difficulty, weakness, light-headedness, numbness and headaches.  Hematological: Negative for adenopathy. Does not bruise/bleed easily.  Psychiatric/Behavioral: Negative for suicidal ideas, hallucinations, behavioral problems, confusion, disturbed wake/sleep cycle, self-injury, dysphoric mood, decreased concentration and agitation. The patient is not nervous/anxious.        Objective:   Physical Exam  Constitutional: He appears well-developed and well-nourished.  HENT:  Head: Normocephalic and atraumatic.  Right Ear: External ear normal.  Left Ear: External ear normal.  Nose: Nose normal.  Mouth/Throat: Oropharynx is clear and moist.  Eyes: Conjunctivae normal and EOM are normal. Pupils are equal, round, and reactive to light. No scleral icterus.  Neck: Normal range of motion. Neck supple. No JVD present. No thyromegaly present.  Cardiovascular: Regular rhythm, normal heart sounds and intact distal pulses.  Exam reveals no gallop and no friction rub.   No murmur heard. Pulmonary/Chest: Effort normal and breath sounds normal. He exhibits no tenderness.  Abdominal: Soft. Bowel sounds are normal. He exhibits no distension and no mass. There is no tenderness.  Genitourinary: Prostate normal and penis normal.  Musculoskeletal: Normal range of motion. He exhibits no edema and no tenderness.  Lymphadenopathy:    He has no cervical adenopathy.  Neurological: He is alert. He has normal reflexes. No cranial nerve deficit. Coordination normal.  Skin: Skin is warm and dry. No rash noted.  Psychiatric: He has a normal mood and affect. His behavior is normal.          Assessment & Plan:    Preventive Health  Examination  CAD.stable Dyslipiedemia BPH

## 2012-09-26 NOTE — Patient Instructions (Signed)
Please check your blood pressure on a regular basis.  If it is consistently greater than 150/90, please make an office appointment.    It is important that you exercise regularly, at least 20 minutes 3 to 4 times per week.  If you develop chest pain or shortness of breath seek  medical attention.  Return in one year for follow-up 

## 2013-02-27 ENCOUNTER — Encounter: Payer: Self-pay | Admitting: Internal Medicine

## 2013-09-13 ENCOUNTER — Encounter: Payer: Medicare Other | Admitting: Internal Medicine

## 2013-09-27 ENCOUNTER — Encounter: Payer: Medicare Other | Admitting: Internal Medicine

## 2020-01-27 ENCOUNTER — Emergency Department (HOSPITAL_COMMUNITY): Payer: Medicare Other

## 2020-01-27 ENCOUNTER — Encounter (HOSPITAL_COMMUNITY): Payer: Self-pay | Admitting: Internal Medicine

## 2020-01-27 ENCOUNTER — Observation Stay (HOSPITAL_COMMUNITY)
Admission: EM | Admit: 2020-01-27 | Discharge: 2020-01-30 | Disposition: A | Discharge status: Home Health | Payer: Medicare Other | Attending: Internal Medicine | Admitting: Internal Medicine

## 2020-01-27 ENCOUNTER — Other Ambulatory Visit: Payer: Self-pay

## 2020-01-27 DIAGNOSIS — Z20822 Contact with and (suspected) exposure to covid-19: Secondary | ICD-10-CM | POA: Diagnosis not present

## 2020-01-27 DIAGNOSIS — I119 Hypertensive heart disease without heart failure: Secondary | ICD-10-CM | POA: Insufficient documentation

## 2020-01-27 DIAGNOSIS — R109 Unspecified abdominal pain: Secondary | ICD-10-CM | POA: Insufficient documentation

## 2020-01-27 DIAGNOSIS — R079 Chest pain, unspecified: Secondary | ICD-10-CM | POA: Diagnosis present

## 2020-01-27 DIAGNOSIS — R030 Elevated blood-pressure reading, without diagnosis of hypertension: Secondary | ICD-10-CM | POA: Diagnosis present

## 2020-01-27 DIAGNOSIS — R0609 Other forms of dyspnea: Principal | ICD-10-CM | POA: Insufficient documentation

## 2020-01-27 DIAGNOSIS — Z66 Do not resuscitate: Secondary | ICD-10-CM | POA: Insufficient documentation

## 2020-01-27 DIAGNOSIS — I2510 Atherosclerotic heart disease of native coronary artery without angina pectoris: Secondary | ICD-10-CM | POA: Diagnosis not present

## 2020-01-27 DIAGNOSIS — Z951 Presence of aortocoronary bypass graft: Secondary | ICD-10-CM | POA: Diagnosis not present

## 2020-01-27 DIAGNOSIS — R531 Weakness: Secondary | ICD-10-CM | POA: Diagnosis not present

## 2020-01-27 DIAGNOSIS — I214 Non-ST elevation (NSTEMI) myocardial infarction: Secondary | ICD-10-CM

## 2020-01-27 DIAGNOSIS — Z87891 Personal history of nicotine dependence: Secondary | ICD-10-CM | POA: Diagnosis not present

## 2020-01-27 DIAGNOSIS — I252 Old myocardial infarction: Secondary | ICD-10-CM | POA: Insufficient documentation

## 2020-01-27 DIAGNOSIS — Z9181 History of falling: Secondary | ICD-10-CM | POA: Diagnosis not present

## 2020-01-27 DIAGNOSIS — H409 Unspecified glaucoma: Secondary | ICD-10-CM | POA: Insufficient documentation

## 2020-01-27 DIAGNOSIS — R0600 Dyspnea, unspecified: Secondary | ICD-10-CM | POA: Diagnosis present

## 2020-01-27 LAB — CBC
HCT: 44.3 % (ref 39.0–52.0)
HCT: 40 % (ref 39.0–52.0)
Hemoglobin: 14.9 g/dL (ref 13.0–17.0)
Hemoglobin: 13.9 g/dL (ref 13.0–17.0)
MCH: 34.8 pg — ABNORMAL HIGH (ref 26.0–34.0)
MCH: 34.9 pg — ABNORMAL HIGH (ref 26.0–34.0)
MCHC: 33.6 g/dL (ref 30.0–36.0)
MCHC: 34.8 g/dL (ref 30.0–36.0)
MCV: 103.5 fL — ABNORMAL HIGH (ref 80.0–100.0)
MCV: 100.5 fL — ABNORMAL HIGH (ref 80.0–100.0)
Platelets: 271 10*3/uL (ref 150–400)
Platelets: 239 10*3/uL (ref 150–400)
RBC: 4.28 MIL/uL (ref 4.22–5.81)
RBC: 3.98 MIL/uL — ABNORMAL LOW (ref 4.22–5.81)
RDW: 14.7 % (ref 11.5–15.5)
RDW: 14.5 % (ref 11.5–15.5)
WBC: 10.3 10*3/uL (ref 4.0–10.5)
WBC: 10.8 10*3/uL — ABNORMAL HIGH (ref 4.0–10.5)
nRBC: 0 % (ref 0.0–0.2)
nRBC: 0 % (ref 0.0–0.2)

## 2020-01-27 LAB — BASIC METABOLIC PANEL
Anion gap: 12 (ref 5–15)
BUN: 14 mg/dL (ref 8–23)
CO2: 25 mmol/L (ref 22–32)
Calcium: 9.6 mg/dL (ref 8.9–10.3)
Chloride: 102 mmol/L (ref 98–111)
Creatinine, Ser: 0.99 mg/dL (ref 0.61–1.24)
GFR calc Af Amer: >60 mL/min (ref >60)
GFR calc non Af Amer: >60 mL/min (ref >60)
Glucose, Bld: 108 mg/dL — ABNORMAL HIGH (ref 70–99)
Potassium: 3.3 mmol/L — ABNORMAL LOW (ref 3.5–5.1)
Sodium: 139 mmol/L (ref 135–145)

## 2020-01-27 LAB — HEPATIC FUNCTION PANEL
ALT: 24 U/L (ref 0–44)
AST: 27 U/L (ref 15–41)
Albumin: 4.2 g/dL (ref 3.5–5.0)
Alkaline Phosphatase: 60 U/L (ref 38–126)
Bilirubin, Direct: <0.1 mg/dL (ref 0.0–0.2)
Total Bilirubin: 0.6 mg/dL (ref 0.3–1.2)
Total Protein: 7.2 g/dL (ref 6.5–8.1)

## 2020-01-27 LAB — TROPONIN I (HIGH SENSITIVITY)
Troponin I (High Sensitivity): 18 ng/L — ABNORMAL HIGH (ref <18)
Troponin I (High Sensitivity): 47 ng/L — ABNORMAL HIGH (ref <18)

## 2020-01-27 LAB — LIPASE, BLOOD: Lipase: 27 U/L (ref 11–51)

## 2020-01-27 MED ORDER — SODIUM CHLORIDE 0.9% FLUSH: 3.0000 mL | Freq: Once | INTRAVENOUS | Status: DC

## 2020-01-27 MED ORDER — IOHEXOL 300 MG/ML  SOLN
100.0000 mL | Freq: Once | INTRAMUSCULAR | Status: AC | PRN
  Administered 2020-01-27: 21:00:00 100 mL via INTRAVENOUS

## 2020-01-27 MED ORDER — ENOXAPARIN SODIUM 40 MG/0.4ML ~~LOC~~ SOLN
40.0000 mg | Freq: Every day | SUBCUTANEOUS | Status: DC
  Administered 2020-01-28 – 2020-01-30 (×3): 40 mg via SUBCUTANEOUS
  Filled 2020-01-27 (×3): qty 0.4

## 2020-01-27 MED ORDER — DIPHENHYDRAMINE HCL 25 MG PO CAPS
25.0000 mg | ORAL_CAPSULE | Freq: Once | ORAL | Status: AC
  Administered 2020-01-28: 25 mg via ORAL
  Filled 2020-01-27: qty 1

## 2020-01-27 MED ORDER — TIMOLOL MALEATE 0.5 % OP SOLN
1.0000 [drp] | Freq: Every day | OPHTHALMIC | Status: DC
  Administered 2020-01-28 – 2020-01-30 (×3): 1 [drp] via OPHTHALMIC
  Filled 2020-01-27: qty 5

## 2020-01-27 MED ORDER — ACETAMINOPHEN 650 MG RE SUPP: 650.0000 mg | Freq: Four times a day (QID) | RECTAL | Status: DC | PRN

## 2020-01-27 MED ORDER — ONDANSETRON HCL 4 MG PO TABS: 4.0000 mg | ORAL_TABLET | Freq: Four times a day (QID) | ORAL | Status: DC | PRN

## 2020-01-27 MED ORDER — LATANOPROST 0.005 % OP SOLN
1.0000 [drp] | Freq: Every day | OPHTHALMIC | Status: DC
  Administered 2020-01-28 – 2020-01-29 (×3): 1 [drp] via OPHTHALMIC
  Filled 2020-01-27: qty 2.5

## 2020-01-27 MED ORDER — ONDANSETRON HCL 4 MG/2ML IJ SOLN: 4.0000 mg | Freq: Four times a day (QID) | INTRAMUSCULAR | Status: DC | PRN

## 2020-01-27 MED ORDER — ALUM & MAG HYDROXIDE-SIMETH 200-200-20 MG/5ML PO SUSP
30.0000 mL | Freq: Once | ORAL | Status: AC
  Administered 2020-01-27: 21:00:00 30 mL via ORAL
  Filled 2020-01-27: qty 30

## 2020-01-27 MED ORDER — ACETAMINOPHEN 325 MG PO TABS: 650.0000 mg | ORAL_TABLET | Freq: Four times a day (QID) | ORAL | Status: DC | PRN

## 2020-01-27 NOTE — ED Notes (Signed)
Patient tolerating fluids. 

## 2020-01-27 NOTE — ED Triage Notes (Signed)
Pt states he came to the ER because he feels he "may be dying" Pt reports chest pain and shortness of breath ongoing for "weeks" pt has his covid vaccine today as well. Pt is alert and ox4, very hard of hearing. Pt does not appear in distress.

## 2020-01-27 NOTE — ED Notes (Signed)
[name] grandson [phone] looking for an update on the patient  

## 2020-01-27 NOTE — H&P (Signed)
History and Physical    [name]:[id number] DOB: 01/24/1926 DOA: 01/27/2020  PCP: Patient, No Pcp Per  Patient coming from: Home.  Chief Complaint: Weakness.  HPI: [name] is a 86 y.o. male with history of CAD status post CABG presently not taking any medication other than eyedrops for glaucoma [place] has been experiencing increasing weakness over the last few weeks and has had falls.  At times he has developed some chest pressure but not able to clearly say when and what.  Denies any nausea vomiting or diarrhea has lost about 10 pounds in the last few weeks.  Given the weakness and loss of weight and at times getting chest pain patient was brought to [facility] by family since patient usually we have before.  ED Course: In the ER patient not in distress labs show hemoglobin 14.9 EKG showed nonspecific changes will need a repeat EKG.  CT abdomen pelvis and CT head were unremarkable.  Covid test was negative.  High sensitive troponin first 1 was 18-second 1 increased to 47.  Initial blood pressure readings were elevated and improved without any intervention.  Patient admitted for further observation.  Review of Systems: As per HPI, rest all negative.   Past Medical History:  Diagnosis Date  . ATHEROSCLEROTIC CARDIOVASCULAR DISEASE 07/10/2007  . BENIGN PROSTATIC HYPERTROPHY 07/10/2007  . COLONIC POLYPS, HX OF 06/14/2007  . CORONARY ARTERY DISEASE 06/14/2007  . DEGENERATIVE JOINT DISEASE 07/10/2007  . DIVERTICULOSIS, COLON 06/14/2007  . GERD 06/14/2007  . HYPERLIPIDEMIA 07/10/2007    Past Surgical History:  Procedure Laterality Date  . CATARACT EXTRACTION    . CORONARY ARTERY BYPASS GRAFT       reports that he has quit smoking. He has never used smokeless tobacco. He reports that he does not drink alcohol or use drugs.  No Known Allergies  Family History  Family history unknown: Yes    Prior to Admission medications   Medication Sig  Start Date End Date Taking? Authorizing Provider  Cholecalciferol (VITAMIN D-3 PO) Take 1 capsule by mouth daily with breakfast.   Yes [provider]  latanoprost (XALATAN) 0.005 % ophthalmic solution Place 1 drop into both eyes at bedtime.   Yes [provider]  Multiple Vitamins-Minerals (ONE-A-DAY MENS 50+) TABS Take 1 tablet by mouth daily with breakfast.   Yes [provider]  timolol (TIMOPTIC) 0.5 % ophthalmic solution Place 1 drop into both eyes in the morning.   Yes [provider]  aspirin 81 MG EC tablet Take 1 tablet (81 mg total) by mouth daily. Swallow whole. Patient not taking: Reported on 01/27/2020 09/26/12   Marletta Lor, MD  atorvastatin (LIPITOR) 80 MG tablet Take 1 tablet (80 mg total) by mouth daily. Patient not taking: Reported on 01/27/2020 09/26/12   Marletta Lor, MD    Physical Exam: Constitutional: Moderately built and nourished. Vitals:   01/27/20 2127 01/27/20 2145 01/27/20 2200 01/27/20 2215  BP:  (!) 205/86 (!) 191/73 (!) 160/73  Pulse:  69 60 61  Resp:  18 (!) 26 (!) 21  Temp:      TempSrc:      SpO2: 98% 98% 97% 96%   Eyes: Anicteric no pallor. ENMT: No discharge from the ears eyes nose or mouth. Neck: No mass felt.  No neck rigidity. Respiratory: No rhonchi or crepitations. Cardiovascular: S1-S2 heard. Abdomen: Soft nontender bowel sounds present. Musculoskeletal: No edema.  No joint effusion.  Skin: No rash. Neurologic: Alert awake oriented time place and person.  Moves all extremities. Psychiatric: Appears normal with normal affect.   Labs on Admission: I have personally reviewed following labs and imaging studies  CBC: Recent Labs  Lab 01/27/20 1450  WBC 10.3  HGB 14.9  HCT 44.3  MCV 103.5*  PLT 271   Basic Metabolic Panel: Recent Labs  Lab 01/27/20 1450  NA 139  K 3.3*  CL 102  CO2 25  GLUCOSE 108*  BUN 14  CREATININE 0.99  CALCIUM 9.6   GFR: CrCl cannot be calculated  (Unknown ideal weight.). Liver Function Tests: Recent Labs  Lab 01/27/20 1847  AST 27  ALT 24  ALKPHOS 60  BILITOT 0.6  PROT 7.2  ALBUMIN 4.2   Recent Labs  Lab 01/27/20 1847  LIPASE 27   No results for input(s): AMMONIA in the last 168 hours. Coagulation Profile: No results for input(s): INR, PROTIME in the last 168 hours. Cardiac Enzymes: No results for input(s): CKTOTAL, CKMB, CKMBINDEX, TROPONINI in the last 168 hours. BNP (last 3 results) No results for input(s): PROBNP in the last 8760 hours. HbA1C: No results for input(s): HGBA1C in the last 72 hours. CBG: No results for input(s): GLUCAP in the last 168 hours. Lipid Profile: No results for input(s): CHOL, HDL, LDLCALC, TRIG, CHOLHDL, LDLDIRECT in the last 72 hours. Thyroid Function Tests: No results for input(s): TSH, T4TOTAL, FREET4, T3FREE, THYROIDAB in the last 72 hours. Anemia Panel: No results for input(s): VITAMINB12, FOLATE, FERRITIN, TIBC, IRON, RETICCTPCT in the last 72 hours. Urine analysis: No results found for: COLORURINE, APPEARANCEUR, LABSPEC, PHURINE, GLUCOSEU, HGBUR, BILIRUBINUR, KETONESUR, PROTEINUR, UROBILINOGEN, NITRITE, LEUKOCYTESUR Sepsis Labs: @LABRCNTIP (procalcitonin:4,lacticidven:4) )No results found for this or any previous visit (from the past 240 hour(s)).   Radiological Exams on Admission: DG Chest 2 View  Result Date: 01/27/2020 CLINICAL DATA:  Chest pain. Additional history provided: Patient reports shortness of breath ongoing for "weeks" EXAM: CHEST - 2 VIEW COMPARISON:  Chest radiograph 04/30/2007 FINDINGS: Prior median sternotomy. Unchanged cardiomegaly. Aortic atherosclerosis. Central pulmonary vascular congestion. Minimal bibasilar atelectasis. No evidence of airspace consolidation within the lungs. No evidence of pleural effusion or pneumothorax. No acute bony abnormality. Thoracic spondylosis. IMPRESSION: Cardiomegaly with central pulmonary vascular congestion. Minimal bibasilar  atelectasis. Aortic atherosclerosis. Electronically Signed   By: 06/30/2007 DO   On: 01/27/2020 15:19   CT Head Wo Contrast  Result Date: 01/27/2020 CLINICAL DATA:  86 year old male with head trauma. EXAM: CT HEAD WITHOUT CONTRAST TECHNIQUE: Contiguous axial images were obtained from the base of the skull through the vertex without intravenous contrast. COMPARISON:  None. FINDINGS: Brain: There is moderate age-related atrophy and chronic microvascular ischemic changes. There is no acute intracranial hemorrhage. No mass effect or midline shift. No extra-axial fluid collection. Vascular: No hyperdense vessel or unexpected calcification. Skull: Normal. Negative for fracture or focal lesion. Sinuses/Orbits: No acute finding. Other: None IMPRESSION: 1. No acute intracranial pathology. 2. Age-related atrophy and chronic microvascular ischemic changes. Electronically Signed   By: 84 M.D.   On: 01/27/2020 21:22   CT ABDOMEN PELVIS W CONTRAST  Result Date: 01/27/2020 CLINICAL DATA:  Generalized abdominal pain EXAM: CT ABDOMEN AND PELVIS WITH CONTRAST TECHNIQUE: Multidetector CT imaging of the abdomen and pelvis was performed using the standard protocol following bolus administration of intravenous contrast. CONTRAST:  03/28/2020 OMNIPAQUE IOHEXOL 300 MG/ML  SOLN COMPARISON:  None. FINDINGS: Lower chest: Dependent atelectatic changes are noted in the bases bilaterally. Calcified hilar lymph  nodes are noted consistent with prior granulomatous disease. A few scattered calcified granulomas are also noted in the left base. Hepatobiliary: Fatty infiltration of the liver is seen without focal mass. A few scattered calcified granulomas are noted. The gallbladder is within normal limits. Pancreas: Unremarkable. No pancreatic ductal dilatation or surrounding inflammatory changes. Spleen: Normal in size without focal abnormality. Adrenals/Urinary Tract: Adrenal glands show no focal mass lesion. No renal calculi or  obstructive changes are noted. Extrarenal pelves are noted bilaterally. The bladder is well distended. A large left-sided bladder diverticulum is noted laterally. Stomach/Bowel: Scattered diverticular change of the colon is noted without evidence of diverticulitis. No obstructive changes are seen. The appendix is within normal limits. No small bowel abnormality is noted. The stomach is within normal limits with the exception of a small sliding-type hiatal hernia. Vascular/Lymphatic: Aortic atherosclerosis. No enlarged abdominal or pelvic lymph nodes. Reproductive: Prostate is unremarkable. Other: No abdominal wall hernia or abnormality. No abdominopelvic ascites. Changes of prior right hernia repair are seen. Musculoskeletal: Chronic L2 compression deformity is noted. Mild degenerative changes are seen. IMPRESSION: Diverticulosis without diverticulitis. Chronic changes as described above without acute abnormality. Electronically Signed   By: Alcide Clever M.D.   On: 01/27/2020 21:29    EKG: Independently reviewed.  Normal sinus rhythm nonspecific ST changes per cardiology repeat EKG.  Assessment/Plan Principal Problem:   Chest pain    1. Generalized weakness cause unclear.  However since patient blood pressure does show macrocytosis we will check B12 and folate levels.  Check TSH.  Get physical therapy consult. 2. Chest pain with history of CAD status post CABG high sensitive troponin slightly increased from initial one we will trend cardiac markers check 2D echo we will keep patient aspirin for now. 3. Elevated blood pressure reading follow I have kept patient on as needed IV hydralazine.  Follow blood pressure trends. 4. History of glaucoma on eyedrops.   DVT prophylaxis: Lovenox. Code Status: Full code. Family Communication: Discussed with patient. Disposition Plan: Home. Consults called: None. Admission status: Observation.   Eduard Clos MD [facility] Pager [phone].  If 7PM-7AM, please contact night-coverage [email or web address] Password [facility]  01/27/2020, 10:49 PM

## 2020-01-27 NOTE — Progress Notes (Signed)
Patient arrived to unit.  Patient with no pain.  Placed on telemetry and verified with CCMD by NT and RN.  Patient grandson, [name] updated by phone around at [phone].  Pt location and room/unit phone number given to grandson.  During phone call, received number for patient son [name] [phone]) who is the primary contact and healthcare POA per patient.  Patient stated to not call son tonight but update him in the morning.

## 2020-01-27 NOTE — ED Provider Notes (Signed)
[facility] EMERGENCY DEPARTMENT Provider Note   CSN: [id number] Arrival date & time: 01/27/20  1426     History Chief Complaint  Patient presents with  . Chest Pain    [name] is a 86 y.o. male.  86 yo M with a chief complaint of fatigue.  Patient states that he had a mechanical fall about 6 weeks ago after which developed shingles.  Shingles have resolved with the patient has felt generally fatigued.  He walks about 1/4 mile each day and feels tired afterwards.  Has been having trouble sleeping at night.  He feels a tightness in his abdomen that seems to come and go.  Is unsure what makes this better or worse.  Denies any nausea vomiting or diarrhea.  Denies any urinary symptoms.  Denies cough congestion or fever.  Denies sick contacts.  [place] doctor and has not had one in a while.  He came here to visit his son who took him to get his coronavirus vaccination today and while he was in the area decided to come to the hospital to figure out what was wrong with him.  The history is provided by the patient.  Chest Pain Associated symptoms: fatigue   Associated symptoms: no abdominal pain, no fever, no headache, no palpitations, no shortness of breath and no vomiting   Illness Severity:  Moderate Onset quality:  Gradual Duration:  6 weeks Timing:  Constant Progression:  Unchanged Chronicity:  New Associated symptoms: chest pain and fatigue   Associated symptoms: no abdominal pain, no congestion, no diarrhea, no fever, no headaches, no myalgias, no rash, no shortness of breath and no vomiting        Past Medical History:  Diagnosis Date  . ATHEROSCLEROTIC CARDIOVASCULAR DISEASE 07/10/2007  . BENIGN PROSTATIC HYPERTROPHY 07/10/2007  . COLONIC POLYPS, HX OF 06/14/2007  . CORONARY ARTERY DISEASE 06/14/2007  . DEGENERATIVE JOINT DISEASE 07/10/2007  . DIVERTICULOSIS, COLON 06/14/2007  . GERD 06/14/2007  . HYPERLIPIDEMIA  07/10/2007    Patient Active Problem List   Diagnosis Date Noted  . Chest pain 01/27/2020  . HYPERLIPIDEMIA 07/10/2007  . ATHEROSCLEROTIC CARDIOVASCULAR DISEASE 07/10/2007  . BENIGN PROSTATIC HYPERTROPHY 07/10/2007  . DEGENERATIVE JOINT DISEASE 07/10/2007  . CORONARY ARTERY DISEASE 06/14/2007  . GERD 06/14/2007  . DIVERTICULOSIS, COLON 06/14/2007  . COLONIC POLYPS, HX OF 06/14/2007    Past Surgical History:  Procedure Laterality Date  . CATARACT EXTRACTION    . CORONARY ARTERY BYPASS GRAFT         Family History  Family history unknown: Yes    Social History   Tobacco Use  . Smoking status: Former Games developer  . Smokeless tobacco: Never Used  Substance Use Topics  . Alcohol use: No  . Drug use: No    Home Medications Prior to Admission medications   Medication Sig Start Date End Date Taking? Authorizing Provider  Cholecalciferol (VITAMIN D-3 PO) Take 1 capsule by mouth daily with breakfast.   Yes [provider]  latanoprost (XALATAN) 0.005 % ophthalmic solution Place 1 drop into both eyes at bedtime.   Yes [provider]  Multiple Vitamins-Minerals (ONE-A-DAY MENS 50+) TABS Take 1 tablet by mouth daily with breakfast.   Yes [provider]  timolol (TIMOPTIC) 0.5 % ophthalmic solution Place 1 drop into both eyes in the morning.   Yes [provider]  aspirin 81 MG EC tablet Take 1 tablet (81 mg total)  by mouth daily. Swallow whole. Patient not taking: Reported on 01/27/2020 09/26/12   Gordy Savers, MD  atorvastatin (LIPITOR) 80 MG tablet Take 1 tablet (80 mg total) by mouth daily. Patient not taking: Reported on 01/27/2020 09/26/12   Gordy Savers, MD    Allergies    Patient has no known allergies.  Review of Systems   Review of Systems  Constitutional: Positive for fatigue. Negative for chills and fever.  HENT: Negative for congestion and facial swelling.   Eyes: Negative for discharge and visual disturbance.    Respiratory: Negative for shortness of breath.   Cardiovascular: Positive for chest pain. Negative for palpitations.  Gastrointestinal: Negative for abdominal pain, diarrhea and vomiting.  Musculoskeletal: Negative for arthralgias and myalgias.  Skin: Negative for color change and rash.  Neurological: Negative for tremors, syncope and headaches.  Psychiatric/Behavioral: Negative for confusion and dysphoric mood.    Physical Exam Updated Vital Signs BP (!) 184/75 (BP Location: Left Arm)   Pulse 71   Temp 98.2 F (36.8 C) (Oral)   Resp 17   SpO2 99%   Physical Exam Vitals and nursing note reviewed.  Constitutional:      Appearance: He is well-developed.  HENT:     Head: Normocephalic and atraumatic.  Eyes:     Pupils: Pupils are equal, round, and reactive to light.  Neck:     Vascular: No JVD.  Cardiovascular:     Rate and Rhythm: Normal rate and regular rhythm.     Pulses:          Dorsalis pedis pulses are 2+ on the right side and 2+ on the left side.     Heart sounds: No murmur. No friction rub. No gallop.   Pulmonary:     Effort: No respiratory distress.     Breath sounds: No wheezing.  Abdominal:     General: There is no distension.     Tenderness: There is no guarding or rebound.  Musculoskeletal:        General: Normal range of motion.     Cervical back: Normal range of motion and neck supple.  Skin:    Coloration: Skin is not pale.     Findings: No rash.  Neurological:     Mental Status: He is alert and oriented to person, place, and time.  Psychiatric:        Behavior: Behavior normal.     ED Results / Procedures / Treatments   Labs (all labs ordered are listed, but only abnormal results are displayed) Labs Reviewed  BASIC METABOLIC PANEL - Abnormal; Notable for the following components:      Result Value   Potassium 3.3 (*)    Glucose, Bld 108 (*)    All other components within normal limits  CBC - Abnormal; Notable for the following components:    MCV 103.5 (*)    MCH 34.8 (*)    All other components within normal limits  TROPONIN I (HIGH SENSITIVITY) - Abnormal; Notable for the following components:   Troponin I (High Sensitivity) 18 (*)    All other components within normal limits  TROPONIN I (HIGH SENSITIVITY) - Abnormal; Notable for the following components:   Troponin I (High Sensitivity) 47 (*)    All other components within normal limits  SARS CORONAVIRUS 2 (TAT 6-24 HRS)  HEPATIC FUNCTION PANEL  LIPASE, BLOOD  CBC  CREATININE, SERUM    EKG EKG Interpretation  Date/Time:  Monday January 27 2020 14:32:22 EST  Ventricular Rate:  88 PR Interval:  256 QRS Duration: 96 QT Interval:  396 QTC Calculation: 479 R Axis:   0 Text Interpretation: Sinus rhythm with 1st degree A-V block with frequent Premature ventricular complexes and Fusion complexes Minimal voltage criteria for LVH, may be normal variant ( R in aVL ) Possible Lateral infarct , age undetermined Inferior infarct , age undetermined Abnormal ECG frquent pvc Otherwise no significant change Confirmed by Deno Etienne [phone]) on 01/27/2020 6:31:05 PM   Radiology DG Chest 2 View  Result Date: 01/27/2020 CLINICAL DATA:  Chest pain. Additional history provided: Patient reports shortness of breath ongoing for "weeks" EXAM: CHEST - 2 VIEW COMPARISON:  Chest radiograph 04/30/2007 FINDINGS: Prior median sternotomy. Unchanged cardiomegaly. Aortic atherosclerosis. Central pulmonary vascular congestion. Minimal bibasilar atelectasis. No evidence of airspace consolidation within the lungs. No evidence of pleural effusion or pneumothorax. No acute bony abnormality. Thoracic spondylosis. IMPRESSION: Cardiomegaly with central pulmonary vascular congestion. Minimal bibasilar atelectasis. Aortic atherosclerosis. Electronically Signed   By: Kellie Simmering DO   On: 01/27/2020 15:19   CT Head Wo Contrast  Result Date: 01/27/2020 CLINICAL DATA:  86 year old male with head trauma. EXAM: CT HEAD  WITHOUT CONTRAST TECHNIQUE: Contiguous axial images were obtained from the base of the skull through the vertex without intravenous contrast. COMPARISON:  None. FINDINGS: Brain: There is moderate age-related atrophy and chronic microvascular ischemic changes. There is no acute intracranial hemorrhage. No mass effect or midline shift. No extra-axial fluid collection. Vascular: No hyperdense vessel or unexpected calcification. Skull: Normal. Negative for fracture or focal lesion. Sinuses/Orbits: No acute finding. Other: None IMPRESSION: 1. No acute intracranial pathology. 2. Age-related atrophy and chronic microvascular ischemic changes. Electronically Signed   By: Anner Crete M.D.   On: 01/27/2020 21:22   CT ABDOMEN PELVIS W CONTRAST  Result Date: 01/27/2020 CLINICAL DATA:  Generalized abdominal pain EXAM: CT ABDOMEN AND PELVIS WITH CONTRAST TECHNIQUE: Multidetector CT imaging of the abdomen and pelvis was performed using the standard protocol following bolus administration of intravenous contrast. CONTRAST:  138mL OMNIPAQUE IOHEXOL 300 MG/ML  SOLN COMPARISON:  None. FINDINGS: Lower chest: Dependent atelectatic changes are noted in the bases bilaterally. Calcified hilar lymph nodes are noted consistent with prior granulomatous disease. A few scattered calcified granulomas are also noted in the left base. Hepatobiliary: Fatty infiltration of the liver is seen without focal mass. A few scattered calcified granulomas are noted. The gallbladder is within normal limits. Pancreas: Unremarkable. No pancreatic ductal dilatation or surrounding inflammatory changes. Spleen: Normal in size without focal abnormality. Adrenals/Urinary Tract: Adrenal glands show no focal mass lesion. No renal calculi or obstructive changes are noted. Extrarenal pelves are noted bilaterally. The bladder is well distended. A large left-sided bladder diverticulum is noted laterally. Stomach/Bowel: Scattered diverticular change of the colon is  noted without evidence of diverticulitis. No obstructive changes are seen. The appendix is within normal limits. No small bowel abnormality is noted. The stomach is within normal limits with the exception of a small sliding-type hiatal hernia. Vascular/Lymphatic: Aortic atherosclerosis. No enlarged abdominal or pelvic lymph nodes. Reproductive: Prostate is unremarkable. Other: No abdominal wall hernia or abnormality. No abdominopelvic ascites. Changes of prior right hernia repair are seen. Musculoskeletal: Chronic L2 compression deformity is noted. Mild degenerative changes are seen. IMPRESSION: Diverticulosis without diverticulitis. Chronic changes as described above without acute abnormality. Electronically Signed   By: Inez Catalina M.D.   On: 01/27/2020 21:29    Procedures Procedures (including critical care time)  Medications Ordered in  ED Medications  sodium chloride flush (NS) 0.9 % injection 3 mL (3 mLs Intravenous Not Given 01/27/20 2038)  latanoprost (XALATAN) 0.005 % ophthalmic solution 1 drop (has no administration in time range)  timolol (TIMOPTIC) 0.5 % ophthalmic solution 1 drop (has no administration in time range)  acetaminophen (TYLENOL) tablet 650 mg (has no administration in time range)    Or  acetaminophen (TYLENOL) suppository 650 mg (has no administration in time range)  ondansetron (ZOFRAN) tablet 4 mg (has no administration in time range)    Or  ondansetron (ZOFRAN) injection 4 mg (has no administration in time range)  enoxaparin (LOVENOX) injection 40 mg (has no administration in time range)  alum & mag hydroxide-simeth (MAALOX/MYLANTA) 200-200-20 MG/5ML suspension 30 mL (30 mLs Oral Given 01/27/20 2035)  iohexol (OMNIPAQUE) 300 MG/ML solution 100 mL (100 mLs Intravenous Contrast Given 01/27/20 2110)    ED Course  I have reviewed the triage vital signs and the nursing notes.  Pertinent labs & imaging results that were available during my care of the patient were reviewed  by me and considered in my medical decision making (see chart for details).    MDM Rules/Calculators/A&P                      86 yo M with a chief complaint of fatigue.  This been going on for about 6 weeks.  Patient states he is only been able to walk about 1/4 mile at a time.  Denies any chest pain or shortness of breath with that.  No obvious findings on exam.  With his complaints of abdominal tightness off and on will obtain a CT scan.  His first troponin is very minimally elevated.  History is completely atypical of ACS.  Will obtain a second troponin.  LFTs and lipase.  Reassess. Second opponent is significantly more elevated than the first.  Still less than 100.  Patient not having any chest discomfort or epigastric squeezing.  Discussed with hospitalist for admission.  CRITICAL CARE Performed by: Rae Roam   Total critical care time: 35 minutes  Critical care time was exclusive of separately billable procedures and treating other patients.  Critical care was necessary to treat or prevent imminent or life-threatening deterioration.  Critical care was time spent personally by me on the following activities: development of treatment plan with patient and/or surrogate as well as nursing, discussions with consultants, evaluation of patient's response to treatment, examination of patient, obtaining history from patient or surrogate, ordering and performing treatments and interventions, ordering and review of laboratory studies, ordering and review of radiographic studies, pulse oximetry and re-evaluation of patient's condition.  The patients results and plan were reviewed and discussed.   Any x-rays performed were independently reviewed by myself.   Differential diagnosis were considered with the presenting HPI.  Medications  sodium chloride flush (NS) 0.9 % injection 3 mL (3 mLs Intravenous Not Given 01/27/20 2038)  latanoprost (XALATAN) 0.005 % ophthalmic solution 1 drop (has  no administration in time range)  timolol (TIMOPTIC) 0.5 % ophthalmic solution 1 drop (has no administration in time range)  acetaminophen (TYLENOL) tablet 650 mg (has no administration in time range)    Or  acetaminophen (TYLENOL) suppository 650 mg (has no administration in time range)  ondansetron (ZOFRAN) tablet 4 mg (has no administration in time range)    Or  ondansetron (ZOFRAN) injection 4 mg (has no administration in time range)  enoxaparin (LOVENOX) injection 40 mg (has  no administration in time range)  alum & mag hydroxide-simeth (MAALOX/MYLANTA) 200-200-20 MG/5ML suspension 30 mL (30 mLs Oral Given 01/27/20 2035)  iohexol (OMNIPAQUE) 300 MG/ML solution 100 mL (100 mLs Intravenous Contrast Given 01/27/20 2110)    Vitals:   01/27/20 2145 01/27/20 2200 01/27/20 2215 01/27/20 2252  BP: (!) 205/86 (!) 191/73 (!) 160/73 (!) 184/75  Pulse: 69 60 61 71  Resp: 18 (!) 26 (!) 21 17  Temp:    98.2 F (36.8 C)  TempSrc:    Oral  SpO2: 98% 97% 96% 99%    Final diagnoses:  Chest pain with moderate risk for cardiac etiology    Admission/ observation were discussed with the admitting physician, patient and/or family and they are comfortable with the plan.    Final Clinical Impression(s) / ED Diagnoses Final diagnoses:  Chest pain with moderate risk for cardiac etiology    Rx / DC Orders ED Discharge Orders    None       Melene Plan, DO 01/27/20 2335

## 2020-01-27 NOTE — ED Notes (Signed)
Patient transported to CT 

## 2020-01-27 NOTE — ED Notes (Signed)
Zymeir Salminen grandson [phone] looking for an update on the patient

## 2020-01-28 ENCOUNTER — Encounter (HOSPITAL_COMMUNITY): Payer: Self-pay | Admitting: Internal Medicine

## 2020-01-28 ENCOUNTER — Observation Stay (HOSPITAL_BASED_OUTPATIENT_CLINIC_OR_DEPARTMENT_OTHER): Payer: Medicare Other

## 2020-01-28 ENCOUNTER — Observation Stay (HOSPITAL_COMMUNITY): Payer: Medicare Other

## 2020-01-28 DIAGNOSIS — R079 Chest pain, unspecified: Secondary | ICD-10-CM | POA: Diagnosis present

## 2020-01-28 DIAGNOSIS — R030 Elevated blood-pressure reading, without diagnosis of hypertension: Secondary | ICD-10-CM | POA: Diagnosis present

## 2020-01-28 DIAGNOSIS — I2510 Atherosclerotic heart disease of native coronary artery without angina pectoris: Secondary | ICD-10-CM | POA: Diagnosis present

## 2020-01-28 DIAGNOSIS — R531 Weakness: Secondary | ICD-10-CM | POA: Diagnosis not present

## 2020-01-28 DIAGNOSIS — I351 Nonrheumatic aortic (valve) insufficiency: Secondary | ICD-10-CM | POA: Diagnosis not present

## 2020-01-28 DIAGNOSIS — R0609 Other forms of dyspnea: Secondary | ICD-10-CM | POA: Diagnosis not present

## 2020-01-28 DIAGNOSIS — R0600 Dyspnea, unspecified: Secondary | ICD-10-CM | POA: Diagnosis present

## 2020-01-28 DIAGNOSIS — Z66 Do not resuscitate: Secondary | ICD-10-CM | POA: Diagnosis present

## 2020-01-28 DIAGNOSIS — I119 Hypertensive heart disease without heart failure: Secondary | ICD-10-CM | POA: Diagnosis not present

## 2020-01-28 LAB — COMPREHENSIVE METABOLIC PANEL
ALT: 22 U/L (ref 0–44)
AST: 25 U/L (ref 15–41)
Albumin: 3.6 g/dL (ref 3.5–5.0)
Alkaline Phosphatase: 61 U/L (ref 38–126)
Anion gap: 10 (ref 5–15)
BUN: 12 mg/dL (ref 8–23)
CO2: 26 mmol/L (ref 22–32)
Calcium: 9.2 mg/dL (ref 8.9–10.3)
Chloride: 103 mmol/L (ref 98–111)
Creatinine, Ser: 0.98 mg/dL (ref 0.61–1.24)
GFR calc Af Amer: >60 mL/min (ref >60)
GFR calc non Af Amer: >60 mL/min (ref >60)
Glucose, Bld: 106 mg/dL — ABNORMAL HIGH (ref 70–99)
Potassium: 3.6 mmol/L (ref 3.5–5.1)
Sodium: 139 mmol/L (ref 135–145)
Total Bilirubin: 1 mg/dL (ref 0.3–1.2)
Total Protein: 6.4 g/dL — ABNORMAL LOW (ref 6.5–8.1)

## 2020-01-28 LAB — TROPONIN I (HIGH SENSITIVITY): Troponin I (High Sensitivity): 49 ng/L — ABNORMAL HIGH (ref <18)

## 2020-01-28 LAB — ECHOCARDIOGRAM COMPLETE
Height: 73 in
Weight: 2716.8 oz

## 2020-01-28 LAB — SARS CORONAVIRUS 2 (TAT 6-24 HRS): SARS Coronavirus 2: NEGATIVE

## 2020-01-28 LAB — CREATININE, SERUM
Creatinine, Ser: 0.84 mg/dL (ref 0.61–1.24)
GFR calc Af Amer: >60 mL/min (ref >60)
GFR calc non Af Amer: >60 mL/min (ref >60)

## 2020-01-28 LAB — D-DIMER, QUANTITATIVE: D-Dimer, Quant: 3.1 ug/mL-FEU — ABNORMAL HIGH (ref 0.00–0.50)

## 2020-01-28 LAB — TSH: TSH: 2.495 u[IU]/mL (ref 0.350–4.500)

## 2020-01-28 LAB — VITAMIN B12: Vitamin B-12: 659 pg/mL (ref 180–914)

## 2020-01-28 MED ORDER — IOHEXOL 350 MG/ML SOLN
80.0000 mL | Freq: Once | INTRAVENOUS | Status: AC | PRN
  Administered 2020-01-28: 19:00:00 80 mL via INTRAVENOUS

## 2020-01-28 MED ORDER — HYDRALAZINE HCL 20 MG/ML IJ SOLN
10.0000 mg | INTRAMUSCULAR | Status: DC | PRN
  Administered 2020-01-28: 10 mg via INTRAVENOUS
  Filled 2020-01-28: qty 1

## 2020-01-28 MED ORDER — CARVEDILOL 3.125 MG PO TABS
3.1250 mg | ORAL_TABLET | Freq: Two times a day (BID) | ORAL | Status: DC
  Administered 2020-01-28 – 2020-01-30 (×4): 3.125 mg via ORAL
  Filled 2020-01-28 (×4): qty 1

## 2020-01-28 NOTE — Progress Notes (Signed)
Progress Note    [name]  ENI:[id number] DOB: Jan 26, 1926  DOA: 01/27/2020 PCP: Patient, No Pcp Per    Brief Narrative:   Chief complaint: CP/DOE  Medical records reviewed and are as summarized below:  [name] is an very pleasant 86 y.o. male past medical history that includes CAD status post MI/CABG 2012, glaucoma, hyperlipidemia presents to the emergency department chief complaint generalized weakness/chest pain intermittently and dyspnea with exertion.  Patient and family reports symptoms have been going on for "several weeks".  Dyspnea with exertion worsening.  [place]  Came to [facility] for evaluation.  Work-up in the emergency department revealed no glaring etiology for his generalized weakness or dyspnea with exertion.  Admitted for further work-up  Assessment/Plan:   Principal Problem:   DOE (dyspnea on exertion) Active Problems:   Generalized weakness   Chest pain   Elevated blood pressure reading   CAD (coronary artery disease)   Coronary atherosclerosis  #1.  Dyspnea with exertion/chest pain.  Has a history of CAD status post CABG 2012.  Medications include aspirin.  Chest pain resolved this morning.  Troponins 18>>47>>49.  Initial EKG with frequent PVCs.  D-dimer elevated chest x-ray with cardiomegaly with central pulmonary vascular congestion, minimal bibasilar atelectasis.  TSH and B12 within the limits of normal, no metabolic derangements, no signs/symptoms of infectious process.  Ambulated this morning and continues with dyspnea on exertion without hypoxia -Obtain a 2D echo -Continue aspirin -Follow folate RBC -Consider cardiology consult depending on echo results  #2.  Generalized weakness.  See #1.  CT of the head no acute intracranial pathology. -PT evaluation -See #1  #3.  Elevated blood pressure.  Not on any home medications for blood pressure. -Monitor closely -As needed  hydralazine  #4.  Glaucoma.  Home medications include eyedrops -Continue home meds    Family Communication/Anticipated D/C date and plan/Code Status   DVT prophylaxis: Lovenox ordered. Code Status: DNR Family Communication: son ken Disposition Plan: home hopefully tomorrow   Medical Consultants:    None.   Anti-Infectives:    None  Subjective:   Awake alert reports feeling "somewhat better".  Denies chest pain.  Does report shortness of breath with ambulation.  Objective:    Vitals:   01/28/20 0016 01/28/20 0356 01/28/20 0740 01/28/20 1146  BP: (!) 170/68 (!) 145/56 (!) 142/59 (!) 174/73  Pulse:  70 70 (!) 59  Resp: 16 17 16 16   Temp:  98.6 F (37 C) 99.3 F (37.4 C) 98 F (36.7 C)  TempSrc:  Oral Oral Oral  SpO2: 97% 97% 96% 99%  Weight:  77 kg    Height:        Intake/Output Summary (Last 24 hours) at 01/28/2020 1149 Last data filed at 01/28/2020 0901 Gross per 24 hour  Intake 350 ml  Output 75 ml  Net 275 ml   Filed Weights   01/27/20 2252 01/28/20 0356  Weight: 77.4 kg 77 kg    Exam: General: Thin somewhat frail-appearing no acute distress CV: Heart sounds are distant regular rate and rhythm no murmur gallop or rub no lower extremity edema Respiratory: No increased work of breathing with conversation breath sounds are distant but clear I hear no wheezes no crackles Abdomen: Nondistended soft positive bowel sounds throughout nontender to palpation no guarding or rebound Musculoskeletal: Joints without swelling/erythema full range of motion moves extremities spontaneously  Data Reviewed:  I have personally reviewed following labs and imaging studies:  Labs: Labs show the following:   Basic Metabolic Panel: Recent Labs  Lab 01/27/20 1450 01/27/20 2321 01/28/20 0724  NA 139  --  139  K 3.3*  --  3.6  CL 102  --  103  CO2 25  --  26  GLUCOSE 108*  --  106*  BUN 14  --  12  CREATININE 0.99 0.84 0.98  CALCIUM 9.6  --  9.2    GFR Estimated Creatinine Clearance: 53.5 mL/min (by C-G formula based on SCr of 0.98 mg/dL). Liver Function Tests: Recent Labs  Lab 01/27/20 1847 01/28/20 0724  AST 27 25  ALT 24 22  ALKPHOS 60 61  BILITOT 0.6 1.0  PROT 7.2 6.4*  ALBUMIN 4.2 3.6   Recent Labs  Lab 01/27/20 1847  LIPASE 27   No results for input(s): AMMONIA in the last 168 hours. Coagulation profile No results for input(s): INR, PROTIME in the last 168 hours.  CBC: Recent Labs  Lab 01/27/20 1450 01/27/20 2321  WBC 10.3 10.8*  HGB 14.9 13.9  HCT 44.3 40.0  MCV 103.5* 100.5*  PLT 271 239   Cardiac Enzymes: No results for input(s): CKTOTAL, CKMB, CKMBINDEX, TROPONINI in the last 168 hours. BNP (last 3 results) No results for input(s): PROBNP in the last 8760 hours. CBG: No results for input(s): GLUCAP in the last 168 hours. D-Dimer: Recent Labs    01/28/20 0724  DDIMER 3.10*   Hgb A1c: No results for input(s): HGBA1C in the last 72 hours. Lipid Profile: No results for input(s): CHOL, HDL, LDLCALC, TRIG, CHOLHDL, LDLDIRECT in the last 72 hours. Thyroid function studies: Recent Labs    01/28/20 0724  TSH 2.495   Anemia work up: Recent Labs    01/28/20 0724  VITAMINB12 659   Sepsis Labs: Recent Labs  Lab 01/27/20 1450 01/27/20 2321  WBC 10.3 10.8*    Microbiology Recent Results (from the past 240 hour(s))  SARS CORONAVIRUS 2 (TAT 6-24 HRS) Nasopharyngeal Nasopharyngeal Swab     Status: None   Collection Time: 01/27/20 10:29 PM   Specimen: Nasopharyngeal Swab  Result Value Ref Range Status   SARS Coronavirus 2 NEGATIVE NEGATIVE Final    Comment: (NOTE) SARS-CoV-2 target nucleic acids are NOT DETECTED. The SARS-CoV-2 RNA is generally detectable in upper and lower respiratory specimens during the acute phase of infection. Negative results do not preclude SARS-CoV-2 infection, do not rule out co-infections with other pathogens, and should not be used as the sole basis for  treatment or other patient management decisions. Negative results must be combined with clinical observations, patient history, and epidemiological information. The expected result is Negative. Fact Sheet for Patients: HairSlick.no Fact Sheet for Healthcare Providers: quierodirigir.com This test is not yet approved or cleared by the [place] FDA and  has been authorized for detection and/or diagnosis of SARS-CoV-2 by FDA under an Emergency Use Authorization (EUA). This EUA will remain  in effect (meaning this test can be used) for the duration of the COVID-19 declaration under Section 56 4(b)(1) of the Act, 21 U.S.C. section 360bbb-3(b)(1), unless the authorization is terminated or revoked sooner. Performed at [facility], [address] [place], [place] [address]     Procedures and diagnostic studies:  DG Chest 2 View  Result Date: 01/27/2020 CLINICAL DATA:  Chest pain. Additional history provided: Patient reports shortness of breath ongoing for "weeks" EXAM: CHEST - 2 VIEW COMPARISON:  Chest  radiograph 04/30/2007 FINDINGS: Prior median sternotomy. Unchanged cardiomegaly. Aortic atherosclerosis. Central pulmonary vascular congestion. Minimal bibasilar atelectasis. No evidence of airspace consolidation within the lungs. No evidence of pleural effusion or pneumothorax. No acute bony abnormality. Thoracic spondylosis. IMPRESSION: Cardiomegaly with central pulmonary vascular congestion. Minimal bibasilar atelectasis. Aortic atherosclerosis. Electronically Signed   By: Kellie Simmering DO   On: 01/27/2020 15:19   CT Head Wo Contrast  Result Date: 01/27/2020 CLINICAL DATA:  86 year old male with head trauma. EXAM: CT HEAD WITHOUT CONTRAST TECHNIQUE: Contiguous axial images were obtained from the base of the skull through the vertex without intravenous contrast. COMPARISON:  None. FINDINGS: Brain: There is moderate age-related atrophy  and chronic microvascular ischemic changes. There is no acute intracranial hemorrhage. No mass effect or midline shift. No extra-axial fluid collection. Vascular: No hyperdense vessel or unexpected calcification. Skull: Normal. Negative for fracture or focal lesion. Sinuses/Orbits: No acute finding. Other: None IMPRESSION: 1. No acute intracranial pathology. 2. Age-related atrophy and chronic microvascular ischemic changes. Electronically Signed   By: Anner Crete M.D.   On: 01/27/2020 21:22   CT ABDOMEN PELVIS W CONTRAST  Result Date: 01/27/2020 CLINICAL DATA:  Generalized abdominal pain EXAM: CT ABDOMEN AND PELVIS WITH CONTRAST TECHNIQUE: Multidetector CT imaging of the abdomen and pelvis was performed using the standard protocol following bolus administration of intravenous contrast. CONTRAST:  111mL OMNIPAQUE IOHEXOL 300 MG/ML  SOLN COMPARISON:  None. FINDINGS: Lower chest: Dependent atelectatic changes are noted in the bases bilaterally. Calcified hilar lymph nodes are noted consistent with prior granulomatous disease. A few scattered calcified granulomas are also noted in the left base. Hepatobiliary: Fatty infiltration of the liver is seen without focal mass. A few scattered calcified granulomas are noted. The gallbladder is within normal limits. Pancreas: Unremarkable. No pancreatic ductal dilatation or surrounding inflammatory changes. Spleen: Normal in size without focal abnormality. Adrenals/Urinary Tract: Adrenal glands show no focal mass lesion. No renal calculi or obstructive changes are noted. Extrarenal pelves are noted bilaterally. The bladder is well distended. A large left-sided bladder diverticulum is noted laterally. Stomach/Bowel: Scattered diverticular change of the colon is noted without evidence of diverticulitis. No obstructive changes are seen. The appendix is within normal limits. No small bowel abnormality is noted. The stomach is within normal limits with the exception of a small  sliding-type hiatal hernia. Vascular/Lymphatic: Aortic atherosclerosis. No enlarged abdominal or pelvic lymph nodes. Reproductive: Prostate is unremarkable. Other: No abdominal wall hernia or abnormality. No abdominopelvic ascites. Changes of prior right hernia repair are seen. Musculoskeletal: Chronic L2 compression deformity is noted. Mild degenerative changes are seen. IMPRESSION: Diverticulosis without diverticulitis. Chronic changes as described above without acute abnormality. Electronically Signed   By: Inez Catalina M.D.   On: 01/27/2020 21:29    Medications:   . enoxaparin (LOVENOX) injection  40 mg Subcutaneous Daily  . latanoprost  1 drop Both Eyes QHS  . sodium chloride flush  3 mL Intravenous Once  . timolol  1 drop Both Eyes Daily   Continuous Infusions:   LOS: 0 days   Radene Gunning NP  [facility]   How to contact the [facility] Attending or Consulting provider [place] or covering provider during after hours [place], for this patient?  1. Check the care team in [facility] and look for a) attending/consulting [facility] provider listed and b) the [facility] team listed 2. Log into [email or web address] and use [facility]'s universal password to access. If you do not have the password, please contact the hospital  operator. 3. Locate the [facility] provider you are looking for under [facility] and page to a number that you can be directly reached. 4. If you still have difficulty reaching the provider, please page the [facility] (Director on Call) for the Hospitalists listed on amion for assistance.  01/28/2020, 11:49 AM

## 2020-01-28 NOTE — Plan of Care (Signed)

## 2020-01-28 NOTE — Progress Notes (Signed)
Call any changes or updates to [name] (grandson) 01/29/20 as son having hernia surgery that day.    Toya Smothers, NP

## 2020-01-28 NOTE — Progress Notes (Signed)
Patient called  Out for writer come check him out reported he did not have full sensation to bilateral lower feet they felt cold to him and the left side of his face felt like it was slightly numb. He has no facial droop his smile is equal,he has no arm drift,he raises both legs equally without diffulculty,His speech remains normal as well as his conversation. Vs taken are no change from earlier today. I reported would alert MD of his concerns. No further changes noted.

## 2020-01-28 NOTE — Progress Notes (Signed)
Clydie Braun NP called back wanted prn bp med given for elevated bp it was given reported see if symtoms got any better. Lucila Maine has arrived is at bedside with patient is aware.

## 2020-01-28 NOTE — Progress Notes (Signed)
Pt BP elevated at 170/68 (94).  APP notified.

## 2020-01-28 NOTE — Progress Notes (Signed)
  Echocardiogram 2D Echocardiogram has been performed.  Ilaria Much G Laelani Vasko 01/28/2020, 1:25 PM

## 2020-01-28 NOTE — Evaluation (Signed)
Physical Therapy Evaluation Patient Details Name: [name] MRN: [id number] DOB: [birth year] Today's Date: 01/28/2020   History of Present Illness  Patient is a 85 y/o male who presents with SOB, chest pain and weakness. Workup pending. PMH includes CAD s/p CABG , HLD, glaucoma.  Clinical Impression  Patient presents with generalized weakness, cognitive deficits, impaired balance, dyspnea on exertion, and impaired mobility s/p above. Pt reports living alone but has support of family for IADLs. Reports some falls at home. Pt not the best historian. Tolerated transfers and gait training with Min A and use of RW for balance/safety. Pt with poor safety awareness. Reports feeling bad post walk and wanting to return to bed, VSS with Sp02 >95% on RA. Not able to elaborate on symptoms. Will need close supervision initially when pt returns home. Will follow acutely to maximize independence and mobility prior to return home. Recommend use of RW for safety.     Follow Up Recommendations Home health PT;Supervision for mobility/OOB    Equipment Recommendations  Rolling walker with 5" wheels    Recommendations for Other Services       Precautions / Restrictions Precautions Precautions: Fall Restrictions Weight Bearing Restrictions: No      Mobility  Bed Mobility Overal bed mobility: Needs Assistance Bed Mobility: Supine to Sit     Supine to sit: Min guard;HOB elevated     General bed mobility comments: Use of rail, no assist needed. No dizziness.  Transfers Overall transfer level: Needs assistance Equipment used: None Transfers: Sit to/from [facility] Sit to Stand: Min assist Stand pivot transfers: Min assist       General transfer comment: Min A to steady in standing with pt reaching around therapist's waist for support. Stood from [employer], from toilet x1, from chair x1. SPT chair to bed post session due to SOB and not feeling  well.  Ambulation/Gait Ambulation/Gait assistance: Min guard;Min assist Gait Distance (Feet): 300 Feet Assistive device: Rolling walker (2 wheeled) Gait Pattern/deviations: Step-through pattern;Decreased stride length;Trunk flexed Gait velocity: varying speeds Gait velocity interpretation: 1.31 - 2.62 ft/sec, indicative of limited community ambulator General Gait Details: Mildly unsteady gait with RW for support; cues for RW management/proximity, hitting LLE on walker legs. 2/4 DOE. Sp02 >95% on RA.  Stairs            Wheelchair Mobility    Modified Rankin (Stroke Patients Only)       Balance Overall balance assessment: Needs assistance;History of Falls Sitting-balance support: Feet supported;No upper extremity supported Sitting balance-Leahy Scale: Good Sitting balance - Comments: Supervision for safety.   Standing balance support: During functional activity Standing balance-Leahy Scale: Fair Standing balance comment: Able to stand at sink and perform ADLs leaning for supprt, close Min guard for safety. Needs UE support for walking.                             Pertinent Vitals/Pain Pain Assessment: No/denies pain    Home Living Family/patient expects to be discharged to:: Private residence Living Arrangements: Alone Available Help at Discharge: Family;Available PRN/intermittently Type of Home: House Home Access: Stairs to enter Entrance Stairs-Rails: Right Entrance Stairs-Number of Steps: 13 Home Layout: One level Home Equipment: Grab bars - tub/shower      Prior Function Level of Independence: Needs assistance   Gait / Transfers Assistance Needed: Furniture walker at baseline, some falls  ADL's / Homemaking Assistance Needed: Independent for ADLs, family helps with  driving, grocery shopping IADLs.  Comments: Drives, cook, Conservation officer, historic buildings Dominance        Extremity/Trunk Assessment   Upper Extremity Assessment Upper Extremity Assessment:  Defer to OT evaluation    Lower Extremity Assessment Lower Extremity Assessment: Generalized weakness(but functional)    Cervical / Trunk Assessment Cervical / Trunk Assessment: Kyphotic  Communication   Communication: HOH  Cognition Arousal/Alertness: Awake/alert Behavior During Therapy: WFL for tasks assessed/performed Overall Cognitive Status: Impaired/Different from baseline Area of Impairment: Safety/judgement;Memory                     Memory: Decreased short-term memory   Safety/Judgement: Decreased awareness of safety;Decreased awareness of deficits     General Comments: Tangential with speech; difficult historian, Easily distracted. Feeling great and then all of a sudden, reports not feeling well post mobility. Poor safety awareness.      General Comments General comments (skin integrity, edema, etc.): VSS throughout on RA.    Exercises     Assessment/Plan    PT Assessment Patient needs continued PT services  PT Problem List Decreased strength;Decreased mobility;Decreased safety awareness;Decreased cognition;Cardiopulmonary status limiting activity;Decreased balance;Decreased knowledge of use of DME;Decreased activity tolerance       PT Treatment Interventions Therapeutic activities;Gait training;Therapeutic exercise;Patient/family education;Balance training;Functional mobility training;DME instruction    PT Goals (Current goals can be found in the Care Plan section)  Acute Rehab PT Goals Patient Stated Goal: to see my grandbaby PT Goal Formulation: With patient Time For Goal Achievement: 02/11/20 Potential to Achieve Goals: Good    Frequency Min 3X/week   Barriers to discharge Decreased caregiver support lives alone    Co-evaluation               AM-PAC PT "6 Clicks" Mobility  Outcome Measure Help needed turning from your back to your side while in a flat bed without using bedrails?: None Help needed moving from lying on your back to  sitting on the side of a flat bed without using bedrails?: A Little Help needed moving to and from a bed to a chair (including a wheelchair)?: A Little Help needed standing up from a chair using your arms (e.g., wheelchair or bedside chair)?: A Little Help needed to walk in hospital room?: A Little Help needed climbing 3-5 steps with a railing? : A Little 6 Click Score: 19    End of Session Equipment Utilized During Treatment: Gait belt Activity Tolerance: Patient tolerated treatment well Patient left: in bed;with call bell/phone within reach;with bed alarm set Nurse Communication: Mobility status PT Visit Diagnosis: Muscle weakness (generalized) (M62.81);Unsteadiness on feet (R26.81);Difficulty in walking, not elsewhere classified (R26.2)    Time: 0752-0826 PT Time Calculation (min) (ACUTE ONLY): 34 min   Charges:   PT Evaluation $PT Eval Moderate Complexity: 1 Mod PT Treatments $Gait Training: 8-22 mins        Vale Haven, PT, DPT Acute Rehabilitation Services Pager [phone] Office [phone]      Blake Divine A Lanier Ensign 01/28/2020, 10:36 AM

## 2020-01-28 NOTE — Progress Notes (Signed)
Additionally pt feet are warm with bilateraly pedal pulses present,patient did state he could feel me touching his toes,feet and lower legs where he stated he was slightly numb. He has received prn hydrazaline will follow up with repeat bp.

## 2020-01-28 NOTE — Progress Notes (Signed)
0700- RN updated patient son, [name] on patient status.  Please call [name] with updates.

## 2020-01-28 NOTE — Progress Notes (Signed)
Paged Dr.Vann for other bp med, she called back to review his meds etc she is aware.

## 2020-01-29 DIAGNOSIS — I214 Non-ST elevation (NSTEMI) myocardial infarction: Secondary | ICD-10-CM

## 2020-01-29 DIAGNOSIS — R0600 Dyspnea, unspecified: Secondary | ICD-10-CM | POA: Diagnosis not present

## 2020-01-29 LAB — FOLATE RBC
Folate, Hemolysate: 416 ng/mL
Folate, RBC: 1051 ng/mL (ref >498)
Hematocrit: 39.6 % (ref 37.5–51.0)

## 2020-01-29 LAB — BASIC METABOLIC PANEL
Anion gap: 9 (ref 5–15)
BUN: 13 mg/dL (ref 8–23)
CO2: 26 mmol/L (ref 22–32)
Calcium: 9.3 mg/dL (ref 8.9–10.3)
Chloride: 104 mmol/L (ref 98–111)
Creatinine, Ser: 1.03 mg/dL (ref 0.61–1.24)
GFR calc Af Amer: >60 mL/min (ref >60)
GFR calc non Af Amer: >60 mL/min (ref >60)
Glucose, Bld: 95 mg/dL (ref 70–99)
Potassium: 4 mmol/L (ref 3.5–5.1)
Sodium: 139 mmol/L (ref 135–145)

## 2020-01-29 MED ORDER — ASPIRIN EC 81 MG PO TBEC
81.0000 mg | DELAYED_RELEASE_TABLET | Freq: Every day | ORAL | Status: DC
  Administered 2020-01-29 – 2020-01-30 (×2): 81 mg via ORAL
  Filled 2020-01-29 (×2): qty 1

## 2020-01-29 MED ORDER — LISINOPRIL 5 MG PO TABS
2.5000 mg | ORAL_TABLET | Freq: Every day | ORAL | Status: DC
  Administered 2020-01-29 – 2020-01-30 (×2): 2.5 mg via ORAL
  Filled 2020-01-29 (×2): qty 1

## 2020-01-29 NOTE — Progress Notes (Signed)
PROGRESS NOTE    [name]  WEX:[id number] DOB: 15-Nov-1926 DOA: 01/27/2020 PCP: Patient, No Pcp Per     Brief Narrative:  86 year old man admitted from home on 3/1 with increased generalized weakness, chest pain and shortness of breath.  He does have a history of coronary artery disease status post CABG and glaucoma.  He lives in [place] and was here visiting her grandson.  He has had several falls, most recently in late January causing a significant laceration to his left forehead.  Admission was requested for further evaluation and management.   Assessment & Plan:   Principal Problem:   DOE (dyspnea on exertion) Active Problems:   Coronary atherosclerosis   Generalized weakness   Elevated blood pressure reading   CAD (coronary artery disease)   Chest pain   DNR (do not resuscitate)   NSTEMI (non-ST elevated myocardial infarction) (HCC)   Generalized weakness and deconditioning -Cause unclear, but suspect cardiac source, probable mild non-ST elevated MI, due to slight increase in high-sensitivity troponin.  Was initially 18 and then crept up into the 40 range.  EKG without acute ischemic changes. -Patient states he had significant chest pain and shortness of breath overnight, but better now. -2D echo showed ejection fraction of 55 to 60%, grade 1 diastolic dysfunction, and mild hypokinesis of the left ventricular, apical anteroseptal wall. -Unclear if these 2D echo changes are new as we do not have any prior records. -Discussed care with grandson at bedside.  Given advanced age, will elect to treat conservatively, will optimize medication regimen, he is already on beta-blocker, add low-dose ACE inhibitor, add low-dose aspirin daily. -Obtain PT/OT consultations today to assist with disposition planning.  Currently, as per grandson, patient lives alone.  Hypertension -Currently well controlled, did receive several IV Lopressor doses overnight. -On Coreg and  lisinopril.  History of coronary artery disease status post CABG -See above for details.  Glaucoma -Continue eyedrops   DVT prophylaxis: Lovenox Code Status: DNR Family Communication: Discussed with grandson at bedside Disposition Plan: As long as no significant chest pain overnight, should be medically ready for discharge in a.m.  Need to obtain PT and OT consultations due to safety concerns at home given his significant weakness and falls, he lives alone.  Consultants:   None  Procedures:   2D echo as above  Antimicrobials:  Anti-infectives (From admission, onward)   None       Subjective: Lying in bed, states had chest pain and shortness of breath overnight.  Objective: Vitals:   01/29/20 0220 01/29/20 0410 01/29/20 0737 01/29/20 1136  BP:  139/76 (!) 167/86 (!) 149/62  Pulse:  69 90 60  Resp:  17 18 16   Temp:  98.1 F (36.7 C) (!) 97.4 F (36.3 C) 97.6 F (36.4 C)  TempSrc:  Oral Oral Oral  SpO2:  98% 99% 100%  Weight: 76.8 kg     Height:        Intake/Output Summary (Last 24 hours) at 01/29/2020 1141 Last data filed at 01/28/2020 1822 Gross per 24 hour  Intake 540 ml  Output --  Net 540 ml   Filed Weights   01/27/20 2252 01/28/20 0356 01/29/20 0220  Weight: 77.4 kg 77 kg 76.8 kg    Examination:  General exam: Alert, awake, oriented x 3 Respiratory system: Clear to auscultation. Respiratory effort normal. Cardiovascular system:RRR. No murmurs, rubs, gallops. Gastrointestinal system: Abdomen is nondistended, soft and nontender. No organomegaly or masses felt. Normal bowel sounds heard. Central  nervous system: Alert and oriented. No focal neurological deficits. Extremities: No C/C/E, +pedal pulses Skin: No rashes, lesions or ulcers Psychiatry: Judgement and insight appear normal. Mood & affect appropriate.     Data Reviewed: I have personally reviewed following labs and imaging studies  CBC: Recent Labs  Lab 01/27/20 1450 01/27/20 2321   WBC 10.3 10.8*  HGB 14.9 13.9  HCT 44.3 40.0  MCV 103.5* 100.5*  PLT 271 239   Basic Metabolic Panel: Recent Labs  Lab 01/27/20 1450 01/27/20 2321 01/28/20 0724  NA 139  --  139  K 3.3*  --  3.6  CL 102  --  103  CO2 25  --  26  GLUCOSE 108*  --  106*  BUN 14  --  12  CREATININE 0.99 0.84 0.98  CALCIUM 9.6  --  9.2   GFR: Estimated Creatinine Clearance: 53.3 mL/min (by C-G formula based on SCr of 0.98 mg/dL). Liver Function Tests: Recent Labs  Lab 01/27/20 1847 01/28/20 0724  AST 27 25  ALT 24 22  ALKPHOS 60 61  BILITOT 0.6 1.0  PROT 7.2 6.4*  ALBUMIN 4.2 3.6   Recent Labs  Lab 01/27/20 1847  LIPASE 27   No results for input(s): AMMONIA in the last 168 hours. Coagulation Profile: No results for input(s): INR, PROTIME in the last 168 hours. Cardiac Enzymes: No results for input(s): CKTOTAL, CKMB, CKMBINDEX, TROPONINI in the last 168 hours. BNP (last 3 results) No results for input(s): PROBNP in the last 8760 hours. HbA1C: No results for input(s): HGBA1C in the last 72 hours. CBG: No results for input(s): GLUCAP in the last 168 hours. Lipid Profile: No results for input(s): CHOL, HDL, LDLCALC, TRIG, CHOLHDL, LDLDIRECT in the last 72 hours. Thyroid Function Tests: Recent Labs    01/28/20 0724  TSH 2.495   Anemia Panel: Recent Labs    01/28/20 0724  VITAMINB12 659   Urine analysis: No results found for: COLORURINE, APPEARANCEUR, LABSPEC, PHURINE, GLUCOSEU, HGBUR, BILIRUBINUR, KETONESUR, PROTEINUR, UROBILINOGEN, NITRITE, LEUKOCYTESUR Sepsis Labs: @LABRCNTIP (procalcitonin:4,lacticidven:4)  ) Recent Results (from the past 240 hour(s))  SARS CORONAVIRUS 2 (TAT 6-24 HRS) Nasopharyngeal Nasopharyngeal Swab     Status: None   Collection Time: 01/27/20 10:29 PM   Specimen: Nasopharyngeal Swab  Result Value Ref Range Status   SARS Coronavirus 2 NEGATIVE NEGATIVE Final    Comment: (NOTE) SARS-CoV-2 target nucleic acids are NOT DETECTED. The  SARS-CoV-2 RNA is generally detectable in upper and lower respiratory specimens during the acute phase of infection. Negative results do not preclude SARS-CoV-2 infection, do not rule out co-infections with other pathogens, and should not be used as the sole basis for treatment or other patient management decisions. Negative results must be combined with clinical observations, patient history, and epidemiological information. The expected result is Negative. Fact Sheet for Patients: 03/28/20 Fact Sheet for Healthcare Providers: HairSlick.no This test is not yet approved or cleared by the quierodirigir.com FDA and  has been authorized for detection and/or diagnosis of SARS-CoV-2 by FDA under an Emergency Use Authorization (EUA). This EUA will remain  in effect (meaning this test can be used) for the duration of the COVID-19 declaration under Section 56 4(b)(1) of the Act, 21 U.S.C. section 360bbb-3(b)(1), unless the authorization is terminated or revoked sooner. Performed at [facility], [address] [place], [place]          Radiology Studies: DG Chest 2 View  Result Date: 01/27/2020 CLINICAL DATA:  Chest  pain. Additional history provided: Patient reports shortness of breath ongoing for "weeks" EXAM: CHEST - 2 VIEW COMPARISON:  Chest radiograph 04/30/2007 FINDINGS: Prior median sternotomy. Unchanged cardiomegaly. Aortic atherosclerosis. Central pulmonary vascular congestion. Minimal bibasilar atelectasis. No evidence of airspace consolidation within the lungs. No evidence of pleural effusion or pneumothorax. No acute bony abnormality. Thoracic spondylosis. IMPRESSION: Cardiomegaly with central pulmonary vascular congestion. Minimal bibasilar atelectasis. Aortic atherosclerosis. Electronically Signed   By: Jackey Loge DO   On: 01/27/2020 15:19   CT Head Wo Contrast  Result Date: 01/27/2020 CLINICAL DATA:   86 year old male with head trauma. EXAM: CT HEAD WITHOUT CONTRAST TECHNIQUE: Contiguous axial images were obtained from the base of the skull through the vertex without intravenous contrast. COMPARISON:  None. FINDINGS: Brain: There is moderate age-related atrophy and chronic microvascular ischemic changes. There is no acute intracranial hemorrhage. No mass effect or midline shift. No extra-axial fluid collection. Vascular: No hyperdense vessel or unexpected calcification. Skull: Normal. Negative for fracture or focal lesion. Sinuses/Orbits: No acute finding. Other: None IMPRESSION: 1. No acute intracranial pathology. 2. Age-related atrophy and chronic microvascular ischemic changes. Electronically Signed   By: Elgie Collard M.D.   On: 01/27/2020 21:22   CT ANGIO CHEST PE W OR WO CONTRAST  Result Date: 01/28/2020 CLINICAL DATA:  86 year old male with shortness of breath and elevated D-dimer. Concern for pulmonary embolism. EXAM: CT ANGIOGRAPHY CHEST WITH CONTRAST TECHNIQUE: Multidetector CT imaging of the chest was performed using the standard protocol during bolus administration of intravenous contrast. Multiplanar CT image reconstructions and MIPs were obtained to evaluate the vascular anatomy. CONTRAST:  45mL OMNIPAQUE IOHEXOL 350 MG/ML SOLN COMPARISON:  Chest radiograph dated 01/27/2020. FINDINGS: Cardiovascular: There is no cardiomegaly or pericardial effusion. Three-vessel coronary vascular calcification and postsurgical changes of CABG. There is moderate atherosclerotic calcification of the thoracic aorta. Evaluation of the pulmonary arteries is somewhat limited due to respiratory motion artifact. No pulmonary artery embolus identified. Mediastinum/Nodes: There is no hilar or mediastinal adenopathy. The esophagus is grossly unremarkable. No mediastinal fluid collection. Lungs/Pleura: Bibasilar subpleural dependent atelectasis. There is a 6 mm calcified nodule or granuloma in the lingula. No focal  consolidation, pleural effusion, or pneumothorax. The central airways are patent. Upper Abdomen: Partially visualized gallstones. Musculoskeletal: Osteopenia with degenerative changes of the spine. Median sternotomy wires. No acute osseous pathology. Review of the MIP images confirms the above findings. IMPRESSION: 1. No acute intrathoracic pathology. No CT evidence of pulmonary embolism. 2. Cholelithiasis. 3. Aortic Atherosclerosis (ICD10-I70.0). Electronically Signed   By: Elgie Collard M.D.   On: 01/28/2020 19:04   CT ABDOMEN PELVIS W CONTRAST  Result Date: 01/27/2020 CLINICAL DATA:  Generalized abdominal pain EXAM: CT ABDOMEN AND PELVIS WITH CONTRAST TECHNIQUE: Multidetector CT imaging of the abdomen and pelvis was performed using the standard protocol following bolus administration of intravenous contrast. CONTRAST:  OMNIPAQUE IOHEXOL 300 MG/ML  SOLN COMPARISON:  None. FINDINGS: Lower chest: Dependent atelectatic changes are noted in the bases bilaterally. Calcified hilar lymph nodes are noted consistent with prior granulomatous disease. A few scattered calcified granulomas are also noted in the left base. Hepatobiliary: Fatty infiltration of the liver is seen without focal mass. A few scattered calcified granulomas are noted. The gallbladder is within normal limits. Pancreas: Unremarkable. No pancreatic ductal dilatation or surrounding inflammatory changes. Spleen: Normal in size without focal abnormality. Adrenals/Urinary Tract: Adrenal glands show no focal mass lesion. No renal calculi or obstructive changes are noted. Extrarenal pelves are noted bilaterally. The bladder is well  distended. A large left-sided bladder diverticulum is noted laterally. Stomach/Bowel: Scattered diverticular change of the colon is noted without evidence of diverticulitis. No obstructive changes are seen. The appendix is within normal limits. No small bowel abnormality is noted. The stomach is within normal limits with  the exception of a small sliding-type hiatal hernia. Vascular/Lymphatic: Aortic atherosclerosis. No enlarged abdominal or pelvic lymph nodes. Reproductive: Prostate is unremarkable. Other: No abdominal wall hernia or abnormality. No abdominopelvic ascites. Changes of prior right hernia repair are seen. Musculoskeletal: Chronic L2 compression deformity is noted. Mild degenerative changes are seen. IMPRESSION: Diverticulosis without diverticulitis. Chronic changes as described above without acute abnormality. Electronically Signed   By: Inez Catalina M.D.   On: 01/27/2020 21:29   ECHOCARDIOGRAM COMPLETE  Result Date: 01/28/2020    ECHOCARDIOGRAM REPORT   Patient Name:   [name] Date of Exam: 01/28/2020 Medical Rec #:  [id number]        Height:       73.0 in Accession #:    [id number]       Weight:       169.8 lb Date of Birth:  [birth year]       BSA:          2.007 m Patient Age:    55 years         BP:           142/59 mmHg Patient Gender: M                HR:           56 bpm. Exam Location:  Inpatient Procedure: 2D Echo, Cardiac Doppler and Color Doppler Indications:    Elevated Troponin.  History:        Patient has no prior history of Echocardiogram examinations.                 CAD; Risk Factors:Dyslipidemia and GERD.  Sonographer:    Jonelle Sidle Dance Referring Phys: [place]  1. Left ventricular ejection fraction, by estimation, is 55 to 60%. The left ventricle has normal function. The left ventricle demonstrates regional wall motion abnormalities (see scoring diagram/findings for description). Left ventricular diastolic parameters are consistent with Grade I diastolic dysfunction (impaired relaxation). There is mild hypokinesis of a limited segment of the apical anteroseptal wall.  2. Right ventricular systolic function is normal. The right ventricular size is normal. There is normal pulmonary artery systolic pressure.  3. The mitral valve is normal in structure and function.  Mild mitral valve regurgitation. No evidence of mitral stenosis.  4. The aortic valve is normal in structure and function. Aortic valve regurgitation is mild to moderate. Mild aortic valve sclerosis is present, with no evidence of aortic valve stenosis.  5. The inferior vena cava is normal in size with greater than 50% respiratory variability, suggesting right atrial pressure of 3 mmHg. Comparison(s): No prior Echocardiogram. FINDINGS  Left Ventricle: Left ventricular ejection fraction, by estimation, is 55 to 60%. The left ventricle has normal function. The left ventricle demonstrates regional wall motion abnormalities. Mild hypokinesis of the left ventricular, apical anteroseptal wall. The left ventricular internal cavity size was normal in size. There is no left ventricular hypertrophy. Left ventricular diastolic parameters are consistent with Grade I diastolic dysfunction (impaired relaxation). Normal left ventricular filling pressure. Right Ventricle: The right ventricular size is normal. No increase in right ventricular wall thickness. Right ventricular systolic function is normal. There is normal pulmonary artery systolic  pressure. The tricuspid regurgitant velocity is 2.02 m/s, and  with an assumed right atrial pressure of 3 mmHg, the estimated right ventricular systolic pressure is 19.3 mmHg. Left Atrium: Left atrial size was normal in size. Right Atrium: Right atrial size was normal in size. Pericardium: There is no evidence of pericardial effusion. Mitral Valve: The mitral valve is normal in structure and function. Normal mobility of the mitral valve leaflets. Mild mitral annular calcification. Mild mitral valve regurgitation. No evidence of mitral valve stenosis. Tricuspid Valve: The tricuspid valve is normal in structure. Tricuspid valve regurgitation is trivial. No evidence of tricuspid stenosis. Aortic Valve: The aortic valve is normal in structure and function. Aortic valve regurgitation is mild to  moderate. Aortic regurgitation PHT measures 746 msec. Mild aortic valve sclerosis is present, with no evidence of aortic valve stenosis. Pulmonic Valve: The pulmonic valve was normal in structure. Pulmonic valve regurgitation is not visualized. No evidence of pulmonic stenosis. Aorta: The aortic root is normal in size and structure. Venous: The inferior vena cava is normal in size with greater than 50% respiratory variability, suggesting right atrial pressure of 3 mmHg. IAS/Shunts: No atrial level shunt detected by color flow Doppler.  LEFT VENTRICLE PLAX 2D LVIDd:         3.91 cm  Diastology LVIDs:         3.30 cm  LV e' lateral:   9.03 cm/s LV PW:         1.08 cm  LV E/e' lateral: 7.7 LV IVS:        0.91 cm  LV e' medial:    5.11 cm/s LVOT diam:     2.00 cm  LV E/e' medial:  13.6 LV SV:         71 LV SV Index:   36 LVOT Area:     3.14 cm  RIGHT VENTRICLE            IVC RV Basal diam:  2.81 cm    IVC diam: 1.94 cm RV S prime:     8.38 cm/s TAPSE (M-mode): 1.3 cm LEFT ATRIUM             Index       RIGHT ATRIUM           Index LA diam:        4.10 cm 2.04 cm/m  RA Area:     13.50 cm LA Vol (A2C):   93.5 ml 46.59 ml/m RA Volume:   34.10 ml  16.99 ml/m LA Vol (A4C):   64.8 ml 32.29 ml/m LA Biplane Vol: 82.1 ml 40.91 ml/m  AORTIC VALVE LVOT Vmax:   96.10 cm/s LVOT Vmean:  65.100 cm/s LVOT VTI:    0.227 m AI PHT:      746 msec  AORTA Ao Root diam: 4.00 cm Ao Asc diam:  3.40 cm MITRAL VALVE               TRICUSPID VALVE MV Area (PHT): 2.37 cm    TR Peak grad:   16.3 mmHg MV Decel Time: 320 msec    TR Vmax:        202.00 cm/s MV E velocity: 69.40 cm/s MV A velocity: 76.20 cm/s  SHUNTS MV E/A ratio:  0.91        Systemic VTI:  0.23 m  Systemic Diam: 2.00 cm Thurmon Fair MD Electronically signed by Thurmon Fair MD Signature Date/Time: 01/28/2020/3:46:07 PM    Final         Scheduled Meds: . aspirin EC  81 mg Oral Daily  . carvedilol  3.125 mg Oral BID WC  . enoxaparin (LOVENOX)  injection  40 mg Subcutaneous Daily  . latanoprost  1 drop Both Eyes QHS  . lisinopril  2.5 mg Oral Daily  . sodium chloride flush  3 mL Intravenous Once  . timolol  1 drop Both Eyes Daily   Continuous Infusions:   LOS: 0 days    Time spent: 35 minutes. Greater than 50% of this time was spent in direct contact with the patient, coordinating care and discussing relevant ongoing clinical issues.    Chaya Jan, MD [facility] Pager [phone]  If 7PM-7AM, please contact night-coverage [email or web address] Password [facility] 01/29/2020, 11:41 AM

## 2020-01-29 NOTE — Progress Notes (Signed)
To the best of my knowledge, the student's charting is accurate.  

## 2020-01-29 NOTE — Progress Notes (Signed)
Physical Therapy Treatment Patient Details Name: [name] MRN: [id number] DOB: [birth year] Today's Date: 01/29/2020    History of Present Illness Patient is a 86 y/o male who presents with SOB, chest pain and weakness. Workup pending. PMH includes CAD s/p CABG , HLD, glaucoma.    PT Comments    Pt in bed with family present upon arrival of PT, agreeable to PT session with focus on progressing independence with mobility. The pt continues to present with limitations in functional mobility, strength, power, and endurance compared to his prior level of function and independence due to above dx. The pt was able to demo good ambulation in the hallway today with VSS and no LOB. The patient completed a 5xsit-to-stand (5XSTS) in 53s with RW and minG for safety. This is above the age-based cut-off of 15s and therefore indicates they are at increased risk for falls. The patient will therefore continue to benefit from skilled PT to address limitations in functional strength, mobility, and balance.     Follow Up Recommendations  Home health PT;Supervision for mobility/OOB     Equipment Recommendations  Rolling walker with 5" wheels    Recommendations for Other Services       Precautions / Restrictions Precautions Precautions: Fall Precaution Comments: HOH Restrictions Weight Bearing Restrictions: No    Mobility  Bed Mobility Overal bed mobility: Needs Assistance Bed Mobility: Supine to Sit     Supine to sit: HOB elevated;Min assist     General bed mobility comments: pt repeatedly asking for assist to sit up, but was able to rise to sitting EOB with minA and elevated HOB  Transfers Overall transfer level: Needs assistance Equipment used: Rolling walker (2 wheeled) Transfers: Sit to/from [facility] Sit to Stand: Min guard         General transfer comment: minG for safety, pt did not need VCs for hands, 5X STS in 53 sec  Ambulation/Gait Ambulation/Gait  assistance: Min guard;Min assist Gait Distance (Feet): 200 Feet Assistive device: Rolling walker (2 wheeled) Gait Pattern/deviations: Step-through pattern;Decreased stride length;Trunk flexed Gait velocity: 0.44 m/s Gait velocity interpretation: 1.31 - 2.62 ft/sec, indicative of limited community ambulator General Gait Details: Mildly unsteady gait with RW for support; cues for RW management/proximity, walking with RW too far out front, benefits from cues X2 to stay closer to the RW. 2/4 DOE. Sp02 >95% on RA.   Stairs             Wheelchair Mobility    Modified Rankin (Stroke Patients Only)       Balance Overall balance assessment: Needs assistance;History of Falls Sitting-balance support: Feet supported;No upper extremity supported Sitting balance-Leahy Scale: Good Sitting balance - Comments: Supervision for safety.   Standing balance support: During functional activity Standing balance-Leahy Scale: Fair Standing balance comment: Able to stand at sink and perform ADLs leaning for supprt, close Min guard for safety. Needs UE support for walking.                            Cognition Arousal/Alertness: Awake/alert Behavior During Therapy: WFL for tasks assessed/performed Overall Cognitive Status: Impaired/Different from baseline Area of Impairment: Safety/judgement;Memory                     Memory: Decreased short-term memory   Safety/Judgement: Decreased awareness of safety;Decreased awareness of deficits     General Comments: Pt with improved cognition, multiple prompts to verbalize feelings/fatigue and pt reports "  I'm good". Needs multiple reminders of stated plan for session (to get into recliner so he can eat lunch) but was able to find his room when returning from ambulation.      Exercises      General Comments General comments (skin integrity, edema, etc.): VSS on RA      Pertinent Vitals/Pain Pain Assessment: No/denies pain     Home Living                      Prior Function            PT Goals (current goals can now be found in the care plan section) Acute Rehab PT Goals Patient Stated Goal: to see my grandbaby PT Goal Formulation: With patient Time For Goal Achievement: 02/11/20 Potential to Achieve Goals: Good Progress towards PT goals: Progressing toward goals    Frequency           PT Plan Current plan remains appropriate    Co-evaluation              AM-PAC PT "6 Clicks" Mobility   Outcome Measure  Help needed turning from your back to your side while in a flat bed without using bedrails?: None Help needed moving from lying on your back to sitting on the side of a flat bed without using bedrails?: A Little Help needed moving to and from a bed to a chair (including a wheelchair)?: A Little Help needed standing up from a chair using your arms (e.g., wheelchair or bedside chair)?: A Little Help needed to walk in hospital room?: A Little Help needed climbing 3-5 steps with a railing? : A Little 6 Click Score: 19    End of Session Equipment Utilized During Treatment: Gait belt Activity Tolerance: Patient tolerated treatment well Patient left: in bed;with call bell/phone within reach;with bed alarm set;with family/visitor present Nurse Communication: Mobility status PT Visit Diagnosis: Muscle weakness (generalized) (M62.81);Unsteadiness on feet (R26.81);Difficulty in walking, not elsewhere classified (R26.2)     Time: 6222-9798 PT Time Calculation (min) (ACUTE ONLY): 23 min  Charges:  $Gait Training: 23-37 mins                     Rolm Baptise, PT, DPT   Acute Rehabilitation Department Pager #: [phone]   Gaetana Michaelis 01/29/2020, 1:44 PM

## 2020-01-29 NOTE — TOC Progression Note (Signed)
Transition of Care [facility]) - Progression Note    Patient Details  Name: [name] MRN: [id number] Date of Birth: [birth year]  Transition of Care [facility]) CM/SW Contact  Leone Haven, RN Phone Number: 01/29/2020, 4:28 PM  Clinical Narrative:    NCM left message for son [name] to return call.  Trying to see if would like HHPT set up for patient.  Awaiting call back.  Patient did not understand information.        Expected Discharge Plan and Services                                                 Social Determinants of Health (SDOH) Interventions    Readmission Risk Interventions No flowsheet data found.

## 2020-01-30 DIAGNOSIS — R0600 Dyspnea, unspecified: Secondary | ICD-10-CM | POA: Diagnosis not present

## 2020-01-30 LAB — BASIC METABOLIC PANEL
Anion gap: 12 (ref 5–15)
BUN: 19 mg/dL (ref 8–23)
CO2: 25 mmol/L (ref 22–32)
Calcium: 9.2 mg/dL (ref 8.9–10.3)
Chloride: 104 mmol/L (ref 98–111)
Creatinine, Ser: 1.14 mg/dL (ref 0.61–1.24)
GFR calc Af Amer: >60 mL/min (ref >60)
GFR calc non Af Amer: 56 mL/min — ABNORMAL LOW (ref >60)
Glucose, Bld: 102 mg/dL — ABNORMAL HIGH (ref 70–99)
Potassium: 3.8 mmol/L (ref 3.5–5.1)
Sodium: 141 mmol/L (ref 135–145)

## 2020-01-30 LAB — CBC
HCT: 39.6 % (ref 39.0–52.0)
Hemoglobin: 13.2 g/dL (ref 13.0–17.0)
MCH: 34.4 pg — ABNORMAL HIGH (ref 26.0–34.0)
MCHC: 33.3 g/dL (ref 30.0–36.0)
MCV: 103.1 fL — ABNORMAL HIGH (ref 80.0–100.0)
Platelets: 213 10*3/uL (ref 150–400)
RBC: 3.84 MIL/uL — ABNORMAL LOW (ref 4.22–5.81)
RDW: 14.8 % (ref 11.5–15.5)
WBC: 9.2 10*3/uL (ref 4.0–10.5)
nRBC: 0 % (ref 0.0–0.2)

## 2020-01-30 MED ORDER — LISINOPRIL 2.5 MG PO TABS: 2.5000 mg | ORAL_TABLET | Freq: Every day | ORAL | 1 refills | Status: AC

## 2020-01-30 MED ORDER — POLYETHYLENE GLYCOL 3350 17 G PO PACK
17.0000 g | PACK | Freq: Every day | ORAL | Status: DC
  Administered 2020-01-30: 17 g via ORAL
  Filled 2020-01-30: qty 1

## 2020-01-30 MED ORDER — CARVEDILOL 3.125 MG PO TABS: 3.1250 mg | ORAL_TABLET | Freq: Two times a day (BID) | ORAL | 2 refills | Status: AC

## 2020-01-30 NOTE — Plan of Care (Signed)
Discharge instructions reviewed with patient and grandson [name] Steinberg.

## 2020-01-30 NOTE — Care Management CC44 (Signed)
Condition Code 44 Documentation Completed  Patient Details  Name: [name] MRN: [id number] Date of Birth: [birth year]   Condition Code 44 given:  Yes Patient signature on Condition Code 44 notice:  Yes Documentation of 2 MD's agreement:  Yes Code 44 added to claim:  Yes    Leone Haven, RN 01/30/2020, 10:14 AM

## 2020-01-30 NOTE — Progress Notes (Signed)
  Mobility Specialist Criteria Algorithm Info.  Mobility Team:  Activity: Refused mobility (Pt declined despite max encouragement. ) Mobility Response: RN notified   01/30/2020 12:39 PM

## 2020-01-30 NOTE — Discharge Summary (Signed)
Physician Discharge Summary  [name] CVE:[id number] DOB: [birth year] DOA: 01/27/2020  PCP: Patient, No Pcp Per  Admit date: 01/27/2020 Discharge date: 01/30/2020  Time spent: 45 minutes  Recommendations for Outpatient Follow-up:  -To be discharged home today in stable condition. -PT/OT Home health services will be arranged due to patient's weakness and deconditioning. -I recommend that he follow-up with his primary care physician within 2 weeks.  Discharge Diagnoses:  Principal Problem:   DOE (dyspnea on exertion) Active Problems:   Coronary atherosclerosis   Generalized weakness   Elevated blood pressure reading   CAD (coronary artery disease)   Chest pain   DNR (do not resuscitate)   NSTEMI (non-ST elevated myocardial infarction) [facility])   Discharge Condition: Stable and improved  Filed Weights   01/28/20 0356 01/29/20 0220 01/30/20 0429  Weight: 77 kg 76.8 kg 77.1 kg    History of present illness:  As per Dr. Hal Hope on 3/1: [name] is a 86 y.o. male with history of CAD status post CABG presently not taking any medication other than eyedrops for glaucoma [place] has been experiencing increasing weakness over the last few weeks and has had falls.  At times he has developed some chest pressure but not able to clearly say when and what.  Denies any nausea vomiting or diarrhea has lost about 10 pounds in the last few weeks.  Given the weakness and loss of weight and at times getting chest pain patient was brought to [facility] by family since patient usually we have before.  Hospital Course:   Generalized weakness and deconditioning -Cause unclear, but suspect cardiac source, probable mild non-ST elevated MI, due to slight increase in high-sensitivity troponin.  Was initially 39 and then crept up into the 40 range.  EKG without acute ischemic changes. -No chest pain/shortness of breath overnight. -2D echo showed ejection fraction  of 55 to 02%, grade 1 diastolic dysfunction, and mild hypokinesis of the left ventricular, apical anteroseptal wall. -Unclear if these 2D echo changes are new as we do not have any prior records. -Discussed care with grandson at bedside.  Given advanced age, will elect to treat conservatively, will optimize medication regimen, he is already on beta-blocker, add low-dose ACE inhibitor, add low-dose aspirin daily. -PT/OT recommending home health services, will arrange.  Hypertension -Currently well controlled. -On Coreg and lisinopril.  History of coronary artery disease status post CABG -See above for details.  Glaucoma -Continue eyedrops  Procedures:  2D echo as above  Consultations:  None  Discharge Instructions  Discharge Instructions    Diet - low sodium heart healthy   Complete by: As directed    Increase activity slowly   Complete by: As directed      Allergies as of 01/30/2020   No Known Allergies     Medication List    TAKE these medications   aspirin 81 MG EC tablet Take 1 tablet (81 mg total) by mouth daily. Swallow whole.   atorvastatin 80 MG tablet Commonly known as: LIPITOR Take 1 tablet (80 mg total) by mouth daily.   carvedilol 3.125 MG tablet Commonly known as: COREG Take 1 tablet (3.125 mg total) by mouth 2 (two) times daily with a meal.   latanoprost 0.005 % ophthalmic solution Commonly known as: XALATAN Place 1 drop into both eyes at bedtime.   lisinopril 2.5 MG tablet Commonly known as: ZESTRIL Take 1 tablet (2.5 mg total) by mouth daily. Start  taking on: January 31, 2020   One-A-Day Mens 50+ Tabs Take 1 tablet by mouth daily with breakfast.   timolol 0.5 % ophthalmic solution Commonly known as: TIMOPTIC Place 1 drop into both eyes in the morning.   VITAMIN D-3 PO Take 1 capsule by mouth daily with breakfast.      No Known Allergies Follow-up Information    your primary care physician. Schedule an appointment as soon as possible  for a visit in 2 week(s).            The results of significant diagnostics from this hospitalization (including imaging, microbiology, ancillary and laboratory) are listed below for reference.    Significant Diagnostic Studies: DG Chest 2 View  Result Date: 01/27/2020 CLINICAL DATA:  Chest pain. Additional history provided: Patient reports shortness of breath ongoing for "weeks" EXAM: CHEST - 2 VIEW COMPARISON:  Chest radiograph 04/30/2007 FINDINGS: Prior median sternotomy. Unchanged cardiomegaly. Aortic atherosclerosis. Central pulmonary vascular congestion. Minimal bibasilar atelectasis. No evidence of airspace consolidation within the lungs. No evidence of pleural effusion or pneumothorax. No acute bony abnormality. Thoracic spondylosis. IMPRESSION: Cardiomegaly with central pulmonary vascular congestion. Minimal bibasilar atelectasis. Aortic atherosclerosis. Electronically Signed   By: Jackey Loge DO   On: 01/27/2020 15:19   CT Head Wo Contrast  Result Date: 01/27/2020 CLINICAL DATA:  86 year old male with head trauma. EXAM: CT HEAD WITHOUT CONTRAST TECHNIQUE: Contiguous axial images were obtained from the base of the skull through the vertex without intravenous contrast. COMPARISON:  None. FINDINGS: Brain: There is moderate age-related atrophy and chronic microvascular ischemic changes. There is no acute intracranial hemorrhage. No mass effect or midline shift. No extra-axial fluid collection. Vascular: No hyperdense vessel or unexpected calcification. Skull: Normal. Negative for fracture or focal lesion. Sinuses/Orbits: No acute finding. Other: None IMPRESSION: 1. No acute intracranial pathology. 2. Age-related atrophy and chronic microvascular ischemic changes. Electronically Signed   By: Elgie Collard M.D.   On: 01/27/2020 21:22   CT ANGIO CHEST PE W OR WO CONTRAST  Result Date: 01/28/2020 CLINICAL DATA:  86 year old male with shortness of breath and elevated D-dimer. Concern for  pulmonary embolism. EXAM: CT ANGIOGRAPHY CHEST WITH CONTRAST TECHNIQUE: Multidetector CT imaging of the chest was performed using the standard protocol during bolus administration of intravenous contrast. Multiplanar CT image reconstructions and MIPs were obtained to evaluate the vascular anatomy. CONTRAST:  90mL OMNIPAQUE IOHEXOL 350 MG/ML SOLN COMPARISON:  Chest radiograph dated 01/27/2020. FINDINGS: Cardiovascular: There is no cardiomegaly or pericardial effusion. Three-vessel coronary vascular calcification and postsurgical changes of CABG. There is moderate atherosclerotic calcification of the thoracic aorta. Evaluation of the pulmonary arteries is somewhat limited due to respiratory motion artifact. No pulmonary artery embolus identified. Mediastinum/Nodes: There is no hilar or mediastinal adenopathy. The esophagus is grossly unremarkable. No mediastinal fluid collection. Lungs/Pleura: Bibasilar subpleural dependent atelectasis. There is a 6 mm calcified nodule or granuloma in the lingula. No focal consolidation, pleural effusion, or pneumothorax. The central airways are patent. Upper Abdomen: Partially visualized gallstones. Musculoskeletal: Osteopenia with degenerative changes of the spine. Median sternotomy wires. No acute osseous pathology. Review of the MIP images confirms the above findings. IMPRESSION: 1. No acute intrathoracic pathology. No CT evidence of pulmonary embolism. 2. Cholelithiasis. 3. Aortic Atherosclerosis (ICD10-I70.0). Electronically Signed   By: Elgie Collard M.D.   On: 01/28/2020 19:04   CT ABDOMEN PELVIS W CONTRAST  Result Date: 01/27/2020 CLINICAL DATA:  Generalized abdominal pain EXAM: CT ABDOMEN AND PELVIS WITH CONTRAST TECHNIQUE: Multidetector CT imaging of  the abdomen and pelvis was performed using the standard protocol following bolus administration of intravenous contrast. CONTRAST:  OMNIPAQUE IOHEXOL 300 MG/ML  SOLN COMPARISON:  None. FINDINGS: Lower chest:  Dependent atelectatic changes are noted in the bases bilaterally. Calcified hilar lymph nodes are noted consistent with prior granulomatous disease. A few scattered calcified granulomas are also noted in the left base. Hepatobiliary: Fatty infiltration of the liver is seen without focal mass. A few scattered calcified granulomas are noted. The gallbladder is within normal limits. Pancreas: Unremarkable. No pancreatic ductal dilatation or surrounding inflammatory changes. Spleen: Normal in size without focal abnormality. Adrenals/Urinary Tract: Adrenal glands show no focal mass lesion. No renal calculi or obstructive changes are noted. Extrarenal pelves are noted bilaterally. The bladder is well distended. A large left-sided bladder diverticulum is noted laterally. Stomach/Bowel: Scattered diverticular change of the colon is noted without evidence of diverticulitis. No obstructive changes are seen. The appendix is within normal limits. No small bowel abnormality is noted. The stomach is within normal limits with the exception of a small sliding-type hiatal hernia. Vascular/Lymphatic: Aortic atherosclerosis. No enlarged abdominal or pelvic lymph nodes. Reproductive: Prostate is unremarkable. Other: No abdominal wall hernia or abnormality. No abdominopelvic ascites. Changes of prior right hernia repair are seen. Musculoskeletal: Chronic L2 compression deformity is noted. Mild degenerative changes are seen. IMPRESSION: Diverticulosis without diverticulitis. Chronic changes as described above without acute abnormality. Electronically Signed   By: Alcide Clever M.D.   On: 01/27/2020 21:29   ECHOCARDIOGRAM COMPLETE  Result Date: 01/28/2020    ECHOCARDIOGRAM REPORT   Patient Name:   [name] Mabin Date of Exam: 01/28/2020 Medical Rec #:  [id number]        Height:       73.0 in Accession #:    [id number]       Weight:       169.8 lb Date of Birth:  05/14/1926       BSA:          2.007 m Patient Age:    89 years         BP:            142/59 mmHg Patient Gender: M                HR:           56 bpm. Exam Location:  Inpatient Procedure: 2D Echo, Cardiac Doppler and Color Doppler Indications:    Elevated Troponin.  History:        Patient has no prior history of Echocardiogram examinations.                 CAD; Risk Factors:Dyslipidemia and GERD.  Sonographer:    Elmarie Shiley Dance Referring Phys: [id number] ARSHAD N KAKRAKANDY IMPRESSIONS  1. Left ventricular ejection fraction, by estimation, is 55 to 60%. The left ventricle has normal function. The left ventricle demonstrates regional wall motion abnormalities (see scoring diagram/findings for description). Left ventricular diastolic parameters are consistent with Grade I diastolic dysfunction (impaired relaxation). There is mild hypokinesis of a limited segment of the apical anteroseptal wall.  2. Right ventricular systolic function is normal. The right ventricular size is normal. There is normal pulmonary artery systolic pressure.  3. The mitral valve is normal in structure and function. Mild mitral valve regurgitation. No evidence of mitral stenosis.  4. The aortic valve is normal in structure and function. Aortic valve regurgitation is mild to moderate. Mild aortic valve sclerosis is present, with no  evidence of aortic valve stenosis.  5. The inferior vena cava is normal in size with greater than 50% respiratory variability, suggesting right atrial pressure of 3 mmHg. Comparison(s): No prior Echocardiogram. FINDINGS  Left Ventricle: Left ventricular ejection fraction, by estimation, is 55 to 60%. The left ventricle has normal function. The left ventricle demonstrates regional wall motion abnormalities. Mild hypokinesis of the left ventricular, apical anteroseptal wall. The left ventricular internal cavity size was normal in size. There is no left ventricular hypertrophy. Left ventricular diastolic parameters are consistent with Grade I diastolic dysfunction (impaired relaxation). Normal left  ventricular filling pressure. Right Ventricle: The right ventricular size is normal. No increase in right ventricular wall thickness. Right ventricular systolic function is normal. There is normal pulmonary artery systolic pressure. The tricuspid regurgitant velocity is 2.02 m/s, and  with an assumed right atrial pressure of 3 mmHg, the estimated right ventricular systolic pressure is 19.3 mmHg. Left Atrium: Left atrial size was normal in size. Right Atrium: Right atrial size was normal in size. Pericardium: There is no evidence of pericardial effusion. Mitral Valve: The mitral valve is normal in structure and function. Normal mobility of the mitral valve leaflets. Mild mitral annular calcification. Mild mitral valve regurgitation. No evidence of mitral valve stenosis. Tricuspid Valve: The tricuspid valve is normal in structure. Tricuspid valve regurgitation is trivial. No evidence of tricuspid stenosis. Aortic Valve: The aortic valve is normal in structure and function. Aortic valve regurgitation is mild to moderate. Aortic regurgitation PHT measures 746 msec. Mild aortic valve sclerosis is present, with no evidence of aortic valve stenosis. Pulmonic Valve: The pulmonic valve was normal in structure. Pulmonic valve regurgitation is not visualized. No evidence of pulmonic stenosis. Aorta: The aortic root is normal in size and structure. Venous: The inferior vena cava is normal in size with greater than 50% respiratory variability, suggesting right atrial pressure of 3 mmHg. IAS/Shunts: No atrial level shunt detected by color flow Doppler.  LEFT VENTRICLE PLAX 2D LVIDd:         3.91 cm  Diastology LVIDs:         3.30 cm  LV e' lateral:   9.03 cm/s LV PW:         1.08 cm  LV E/e' lateral: 7.7 LV IVS:        0.91 cm  LV e' medial:    5.11 cm/s LVOT diam:     2.00 cm  LV E/e' medial:  13.6 LV SV:         71 LV SV Index:   36 LVOT Area:     3.14 cm  RIGHT VENTRICLE            IVC RV Basal diam:  2.81 cm    IVC diam:  1.94 cm RV S prime:     8.38 cm/s TAPSE (M-mode): 1.3 cm LEFT ATRIUM             Index       RIGHT ATRIUM           Index LA diam:        4.10 cm 2.04 cm/m  RA Area:     13.50 cm LA Vol (A2C):   93.5 ml 46.59 ml/m RA Volume:   34.10 ml  16.99 ml/m LA Vol (A4C):   64.8 ml 32.29 ml/m LA Biplane Vol: 82.1 ml 40.91 ml/m  AORTIC VALVE LVOT Vmax:   96.10 cm/s LVOT Vmean:  65.100 cm/s LVOT VTI:    0.227  m AI PHT:      746 msec  AORTA Ao Root diam: 4.00 cm Ao Asc diam:  3.40 cm MITRAL VALVE               TRICUSPID VALVE MV Area (PHT): 2.37 cm    TR Peak grad:   16.3 mmHg MV Decel Time: 320 msec    TR Vmax:        202.00 cm/s MV E velocity: 69.40 cm/s MV A velocity: 76.20 cm/s  SHUNTS MV E/A ratio:  0.91        Systemic VTI:  0.23 m                            Systemic Diam: 2.00 cm Rachelle HoraMihai Croitoru MD Electronically signed by Thurmon FairMihai Croitoru MD Signature Date/Time: 01/28/2020/3:46:07 PM    Final     Microbiology: Recent Results (from the past 240 hour(s))  SARS CORONAVIRUS 2 (TAT 6-24 HRS) Nasopharyngeal Nasopharyngeal Swab     Status: None   Collection Time: 01/27/20 10:29 PM   Specimen: Nasopharyngeal Swab  Result Value Ref Range Status   SARS Coronavirus 2 NEGATIVE NEGATIVE Final    Comment: (NOTE) SARS-CoV-2 target nucleic acids are NOT DETECTED. The SARS-CoV-2 RNA is generally detectable in upper and lower respiratory specimens during the acute phase of infection. Negative results do not preclude SARS-CoV-2 infection, do not rule out co-infections with other pathogens, and should not be used as the sole basis for treatment or other patient management decisions. Negative results must be combined with clinical observations, patient history, and epidemiological information. The expected result is Negative. Fact Sheet for Patients: HairSlick.nohttps://www.fda.gov/media/138098/download Fact Sheet for Healthcare Providers: quierodirigir.comhttps://www.fda.gov/media/138095/download This test is not yet approved or cleared by the  [place] FDA and  has been authorized for detection and/or diagnosis of SARS-CoV-2 by FDA under an Emergency Use Authorization (EUA). This EUA will remain  in effect (meaning this test can be used) for the duration of the COVID-19 declaration under Section 56 4(b)(1) of the Act, 21 U.S.C. section 360bbb-3(b)(1), unless the authorization is terminated or revoked sooner. Performed at [facility], [address] [place], [place] [address]      Labs: Basic Metabolic Panel: Recent Labs  Lab 01/27/20 1450 01/27/20 2321 01/28/20 0724 01/29/20 0601 01/30/20 0241  NA 139  --  139 139 141  K 3.3*  --  3.6 4.0 3.8  CL 102  --  103 104 104  CO2 25  --  26 26 25   GLUCOSE 108*  --  106* 95 102*  BUN 14  --  12 13 19   CREATININE 0.99 0.84 0.98 1.03 1.14  CALCIUM 9.6  --  9.2 9.3 9.2   Liver Function Tests: Recent Labs  Lab 01/27/20 1847 01/28/20 0724  AST 27 25  ALT 24 22  ALKPHOS 60 61  BILITOT 0.6 1.0  PROT 7.2 6.4*  ALBUMIN 4.2 3.6   Recent Labs  Lab 01/27/20 1847  LIPASE 27   No results for input(s): AMMONIA in the last 168 hours. CBC: Recent Labs  Lab 01/27/20 1450 01/27/20 2321 01/28/20 0724 01/30/20 0241  WBC 10.3 10.8*  --  9.2  HGB 14.9 13.9  --  13.2  HCT 44.3 40.0 39.6 39.6  MCV 103.5* 100.5*  --  103.1*  PLT 271 239  --  213   Cardiac Enzymes: No results for input(s): CKTOTAL, CKMB, CKMBINDEX, TROPONINI in the last  168 hours. BNP: BNP (last 3 results) No results for input(s): BNP in the last 8760 hours.  ProBNP (last 3 results) No results for input(s): PROBNP in the last 8760 hours.  CBG: No results for input(s): GLUCAP in the last 168 hours.     Signed:  Chaya Jan  [facility] Pager: [phone] 01/30/2020, 9:24 AM

## 2020-01-30 NOTE — Care Management Obs Status (Signed)
MEDICARE OBSERVATION STATUS NOTIFICATION   Patient Details  Name: [name] MRN: [id number] Date of Birth: 28-Sep-1926   Medicare Observation Status Notification Given:  Yes    Leone Haven, RN 01/30/2020, 10:13 AM

## 2020-01-30 NOTE — Significant Event (Signed)
Patient with discharge orders, all discharge instructions reviewed with patient and grandson Rosalee Kaufman questions regarding disease process referred to Dr Ardyth Harps. IV removed, patient dressed and sent home with walker.

## 2020-01-30 NOTE — TOC Transition Note (Signed)
Transition of Care [facility]) - CM/SW Discharge Note   Patient Details  Name: [name] MRN: [id number] Date of Birth: 1926/04/08  Transition of Care [facility]) CM/SW Contact:  Leone Haven, RN Phone Number: 01/30/2020, 11:20 AM   Clinical Narrative:    Patient for dc today, NCM spoke with son, [name], he states [name] will be transporting patient to Ken's home or to his home today.  [name] is also being discharged from the hospital in [place].  Patient will need rolling walker, referral made to [facility] with [facility] , he will bring to room prior to dc.  NCM spoke with Ken's wife, and she state yes they would like HHPT/HHOT for patient and they do not have a preference of which agency.  NCM made referral to [facility] at [phone], she states they will plan to take referral and to fax information to [phone] with soc for Monday.   Final next level of care: Home w Home Health Services Barriers to Discharge: No Barriers Identified   Patient Goals and CMS Choice Patient states their goals for this hospitalization and ongoing recovery are:: get better CMS Medicare.gov Compare Post Acute Care list provided to:: Patient Represenative (must comment)(son) Choice offered to / list presented to : Adult Children  Discharge Placement                       Discharge Plan and Services                DME Arranged: Walker rolling DME Agency: [facility] Date DME Agency Contacted: 01/30/20 Time DME Agency Contacted: [phone] Representative spoke with at DME Agency: Ian Malkin HH Arranged: PT, OT HH Agency: [facility] Date HH Agency Contacted: 01/30/20 Time HH Agency Contacted: 1120 Representative spoke with at [facility] Agency: Marchelle Folks  Social Determinants of Health (SDOH) Interventions     Readmission Risk Interventions No flowsheet data found.

## 2020-01-30 NOTE — Evaluation (Signed)
Occupational Therapy Evaluation Patient Details Name: [name] MRN: [id number] DOB: 07-08-1926 Today's Date: 01/30/2020    History of Present Illness Patient is a 86 y/o male who presents with SOB, chest pain and weakness. Workup pending. PMH includes CAD s/p CABG , HLD, glaucoma.   Clinical Impression   Patient is a 86 year old male, per patient he moved in with this son in his single level home with 13 steps to enter. Patient reports not using an AD at baseline but history of falls. Patient is questionable historian, confusing his sons and grandson's, making it difficult to follow who actually lives with him (per PT eval patient reports living alone). Patient also with impulsivity trying to stand and pulling on therapist before OT ready, may partially be due to patient's [facility] Currently patient is min A for functional transfers and ambulation for safety with min.mod cues for scanning at sink side to perform g/h tasks. Recommend continued acute OT services to maximize patient safety and independence with self care.    Follow Up Recommendations  Home health OT;Supervision/Assistance - 24 hour    Equipment Recommendations  Toilet rise with handles;Other (comment);Tub/shower bench(rolling walker)       Precautions / Restrictions Precautions Precautions: Fall Precaution Comments: HOH Restrictions Weight Bearing Restrictions: No      Mobility Bed Mobility Overal bed mobility: Needs Assistance Bed Mobility: Supine to Sit     Supine to sit: Min guard        Transfers Overall transfer level: Needs assistance Equipment used: Rolling walker (2 wheeled) Transfers: Sit to/from Stand Sit to Stand: Min assist         General transfer comment: min A for safety and verbal cues for hand placement    Balance Overall balance assessment: Needs assistance;History of Falls Sitting-balance support: Feet supported;No upper extremity supported Sitting balance-Leahy Scale: Good      Standing balance support: During functional activity;Bilateral upper extremity supported Standing balance-Leahy Scale: Poor                             ADL either performed or assessed with clinical judgement   ADL Overall ADL's : Needs assistance/impaired     Grooming: Minimal assistance;Standing;Oral care;Wash/dry face Grooming Details (indicate cue type and reason): min A for safety in standing Upper Body Bathing: Set up;Sitting   Lower Body Bathing: Minimal assistance;Sit to/from stand;Sitting/lateral leans   Upper Body Dressing : Set up;Sitting   Lower Body Dressing: Minimal assistance;Sit to/from stand;Sitting/lateral leans   Toilet Transfer: Minimal assistance;Ambulation;RW;BSC Toilet Transfer Details (indicate cue type and reason): simulated with transfer to recliner, min A for safety/steadying assist Toileting- Clothing Manipulation and Hygiene: Minimal assistance;Sit to/from stand;Sitting/lateral lean       Functional mobility during ADLs: Minimal assistance;Rolling walker;Cueing for safety General ADL Comments: patient requires increased assistance for self care due to decreased cognition, activity tolerance, safety awareness, balance with history of falls                  Pertinent Vitals/Pain Pain Assessment: Faces Faces Pain Scale: Hurts little more Pain Location: abdomen, legs Pain Descriptors / Indicators: Sore(stiff, abdomen pain from constipation) Pain Intervention(s): Monitored during session     Hand Dominance Right   Extremity/Trunk Assessment Upper Extremity Assessment Upper Extremity Assessment: Generalized weakness       Cervical / Trunk Assessment Cervical / Trunk Assessment: Kyphotic   Communication Communication Communication: HOH   Cognition Arousal/Alertness: Awake/alert Behavior  During Therapy: WFL for tasks assessed/performed Overall Cognitive Status: No family/caregiver present to determine baseline cognitive  functioning Area of Impairment: Memory;Following commands;Safety/judgement;Problem solving                     Memory: Decreased short-term memory Following Commands: Follows one step commands inconsistently;Follows one step commands with increased time Safety/Judgement: Decreased awareness of safety;Decreased awareness of deficits   Problem Solving: Requires verbal cues;Requires tactile cues General Comments: Pt did repeat self at times but demonstrated good safety awareness, orientation, and ability to follow commans.  Pt is discharging home with son at [facility] and has 13 steps to enter house.  Grandson present.              Home Living Family/patient expects to be discharged to:: Private residence Living Arrangements: Children Available Help at Discharge: Family Type of Home: House Home Access: Stairs to enter Secretary/administrator of Steps: 13 Entrance Stairs-Rails: Right Home Layout: One level     Bathroom Shower/Tub: Chief Strategy Officer: Standard     Home Equipment: Grab bars - tub/shower   Additional Comments: per patient he moved in with his son after his spouse passed away, son recently had surgery but should be home before patient is discharged. Pt also mentions a grandson that lives nearby and helps out      Prior Functioning/Environment Level of Independence: Needs assistance  Gait / Transfers Assistance Needed: Furniture walker at baseline, some falls ADL's / Homemaking Assistance Needed: Independent for ADLs, family helps with driving, grocery shopping IADLs.            OT Problem List: Decreased activity tolerance;Impaired balance (sitting and/or standing);Decreased cognition;Decreased safety awareness      OT Treatment/Interventions: Self-care/ADL training;Therapeutic exercise;Patient/family education;Balance training;Therapeutic activities;DME and/or AE instruction;Cognitive remediation/compensation    OT Goals(Current  goals can be found in the care plan section) Acute Rehab OT Goals Patient Stated Goal: to go to the bathroom OT Goal Formulation: With patient Time For Goal Achievement: 02/13/20 Potential to Achieve Goals: Good ADL Goals Pt Will Perform Upper Body Dressing: sitting;Independently Pt Will Perform Lower Body Dressing: with modified independence;with supervision;sitting/lateral leans;sit to/from stand Pt Will Transfer to Toilet: with supervision;ambulating(walker, comfort height toilet) Pt Will Perform Toileting - Clothing Manipulation and hygiene: with supervision;sitting/lateral leans;sit to/from stand Pt Will Perform Tub/Shower Transfer: Tub transfer;with supervision;grab bars;ambulating;rolling walker;shower seat  OT Frequency: Min 2X/week    AM-PAC OT "6 Clicks" Daily Activity     Outcome Measure Help from another person eating meals?: None Help from another person taking care of personal grooming?: A Little Help from another person toileting, which includes using toliet, bedpan, or urinal?: A Little Help from another person bathing (including washing, rinsing, drying)?: A Little Help from another person to put on and taking off regular upper body clothing?: A Little Help from another person to put on and taking off regular lower body clothing?: A Little 6 Click Score: 19   End of Session Equipment Utilized During Treatment: Rolling walker Nurse Communication: Mobility status  Activity Tolerance: Patient tolerated treatment well Patient left: in chair;with call bell/phone within reach;with chair alarm set  OT Visit Diagnosis: Unsteadiness on feet (R26.81);Other abnormalities of gait and mobility (R26.89);Muscle weakness (generalized) (M62.81);History of falling (Z91.81);Other symptoms and signs involving cognitive function                Time: 4081-4481 OT Time Calculation (min): 27 min Charges:  OT General Charges $OT Visit: 1  Visit OT Evaluation $OT Eval Moderate Complexity: 1  Mod OT Treatments $Self Care/Home Management : 8-22 mins  Zeb Comfort OT office: [phone]  Carmelia Roller 01/30/2020, 11:43 AM

## 2020-01-30 NOTE — Progress Notes (Signed)
Physical Therapy Treatment Patient Details Name: [name] MRN: [id number] DOB: [birth year] Today's Date: 01/30/2020    History of Present Illness Patient is a 86 y/o male who presents with SOB, chest pain and weakness. Workup pending. PMH includes CAD s/p CABG , HLD, glaucoma.    PT Comments    Pt making good progress.  He was able to ambulate 300' with RW and perform a flight of stairs with min guard.  Needs min cues for safety and has 24 hr support at home.     Follow Up Recommendations  Home health PT;Supervision for mobility/OOB     Equipment Recommendations  Rolling walker with 5" wheels    Recommendations for Other Services       Precautions / Restrictions Precautions Precautions: Fall Precaution Comments: HOH    Mobility  Bed Mobility Overal bed mobility: Needs Assistance Bed Mobility: Supine to Sit     Supine to sit: Supervision        Transfers Overall transfer level: Needs assistance Equipment used: Rolling walker (2 wheeled) Transfers: Sit to/from Stand Sit to Stand: Supervision            Ambulation/Gait Ambulation/Gait assistance: Min guard Gait Distance (Feet): 300 Feet Assistive device: Rolling walker (2 wheeled) Gait Pattern/deviations: Trunk flexed;Step-through pattern     General Gait Details: cues for posture and RW proximity; HR 55-65 bpm and O2 sats 95% on RA   Stairs Stairs: Yes Stairs assistance: Min guard Stair Management: Two rails;Alternating pattern Number of Stairs: 18 General stair comments: educated on could do step to pattern if needed and having supervision with stairs   Wheelchair Mobility    Modified Rankin (Stroke Patients Only)       Balance Overall balance assessment: Needs assistance;History of Falls Sitting-balance support: Feet supported;No upper extremity supported Sitting balance-Leahy Scale: Normal     Standing balance support: During functional activity;No upper extremity  supported Standing balance-Leahy Scale: Fair                              Cognition Arousal/Alertness: Awake/alert Behavior During Therapy: WFL for tasks assessed/performed Overall Cognitive Status: Within Functional Limits for tasks assessed                                 General Comments: Pt did repeat self at times but demonstrated good safety awareness, orientation, and ability to follow commans.  Pt is discharging home with son at [facility] and has 13 steps to enter house.  Grandson present.      Exercises      General Comments        Pertinent Vitals/Pain Pain Assessment: No/denies pain    Home Living                      Prior Function            PT Goals (current goals can now be found in the care plan section) Progress towards PT goals: Progressing toward goals    Frequency    Min 3X/week      PT Plan Current plan remains appropriate    Co-evaluation              AM-PAC PT "6 Clicks" Mobility   Outcome Measure  Help needed turning from your back to your side while in a flat bed without using  bedrails?: None Help needed moving from lying on your back to sitting on the side of a flat bed without using bedrails?: None Help needed moving to and from a bed to a chair (including a wheelchair)?: None Help needed standing up from a chair using your arms (e.g., wheelchair or bedside chair)?: None Help needed to walk in hospital room?: None Help needed climbing 3-5 steps with a railing? : A Little 6 Click Score: 23    End of Session Equipment Utilized During Treatment: Gait belt Activity Tolerance: Patient tolerated treatment well Patient left: in bed;with call bell/phone within reach;with bed alarm set;with family/visitor present Nurse Communication: Mobility status PT Visit Diagnosis: Muscle weakness (generalized) (M62.81);Unsteadiness on feet (R26.81);Difficulty in walking, not elsewhere classified (R26.2)      Time: 1030-1055 PT Time Calculation (min) (ACUTE ONLY): 25 min  Charges:  $Gait Training: 23-37 mins                     [name], PT Acute Rehab Services Pager [phone] [facility] [phone]    [name] 01/30/2020, 11:20 AM

## 2020-10-14 IMAGING — CT CT ANGIO CHEST
2 of 6 series · 19 of 36 positions shown · IV contrast (omnipaque)
Comparison: Chest radiograph dated 01/27/2020.

CLINICAL DATA: [AGE] male with shortness of breath and
elevated D-dimer. Concern for pulmonary embolism.

EXAM:
CT ANGIOGRAPHY CHEST WITH CONTRAST
TECHNIQUE: Multidetector CT imaging of the chest was performed using the
standard protocol during bolus administration of intravenous
contrast. Multiplanar CT image reconstructions and MIPs were
obtained to evaluate the vascular anatomy.
CONTRAST:  80mL OMNIPAQUE IOHEXOL 350 MG/ML SOLN

[Series 7: pe thins · axial · 0.75mm/px · z∈[-261,-13]mm · 18 of 395 slices shown]
[im 20/395  lung]
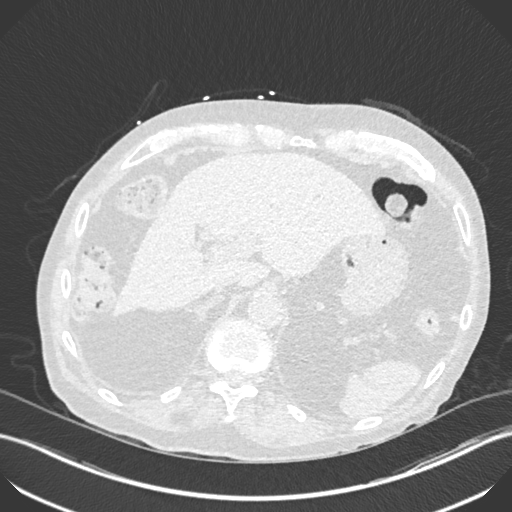
[im 40/395  mediastinal]
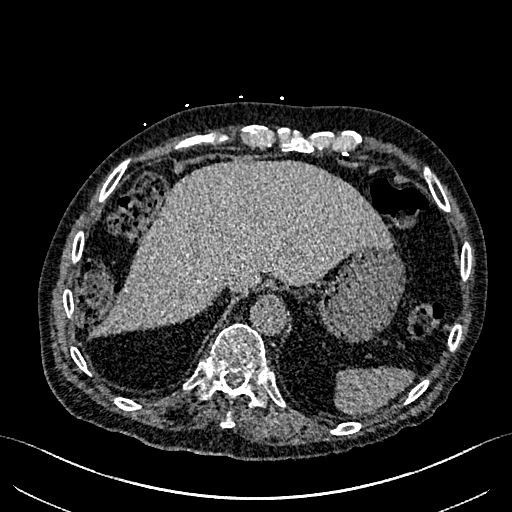
[im 60/395  lung]
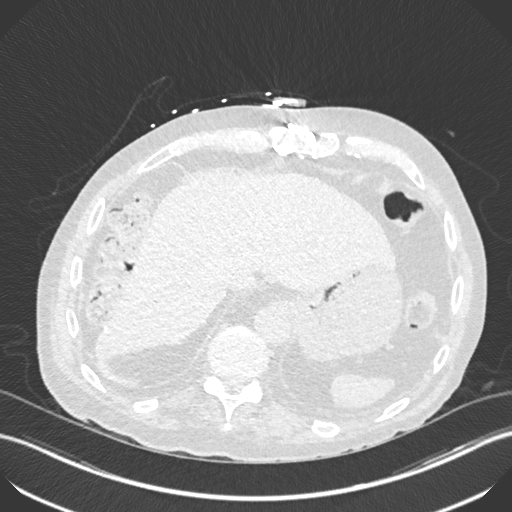
[im 79/395  mediastinal]
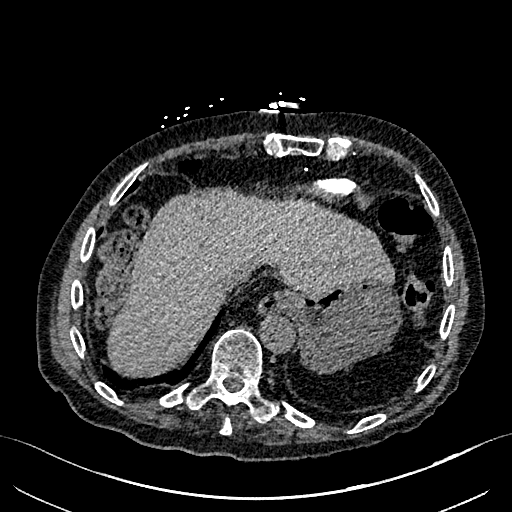
[im 99/395  lung]
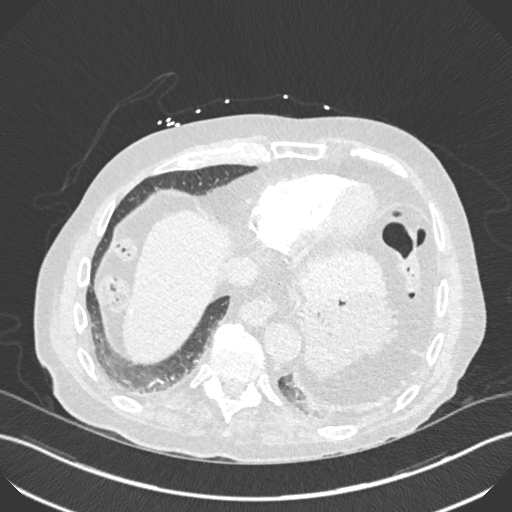
[im 119/395  mediastinal]
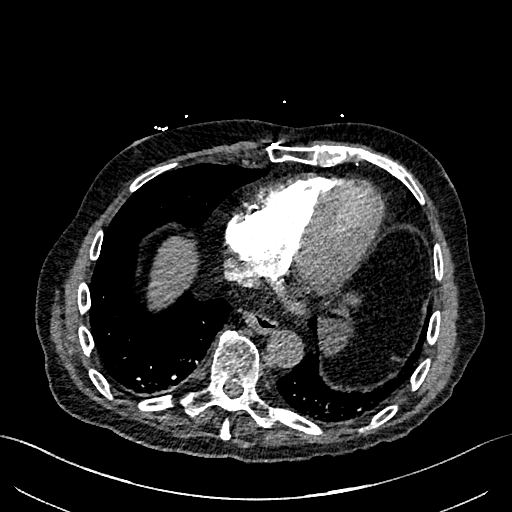
[im 138/395  lung]
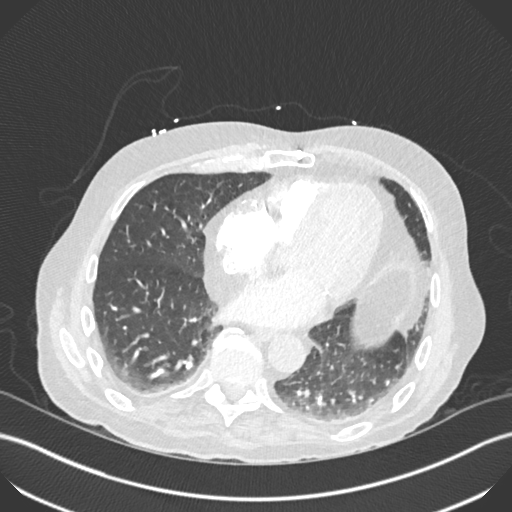
[im 158/395  mediastinal]
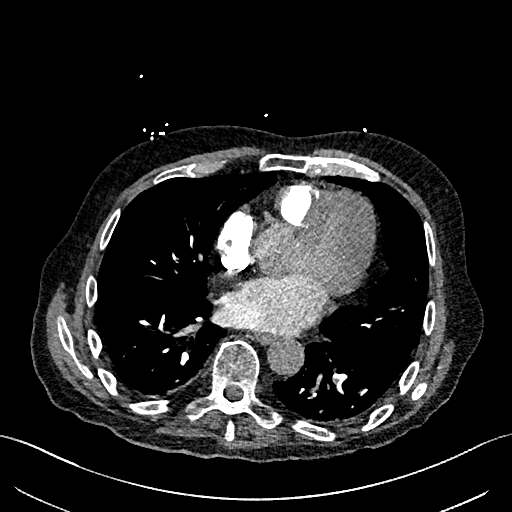
[im 178/395  lung]
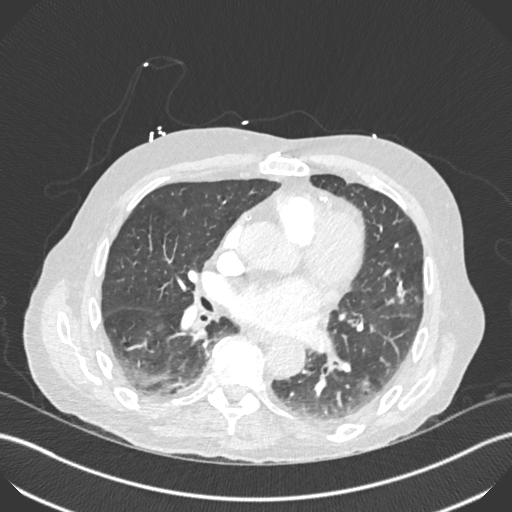
[im 217/395  mediastinal]
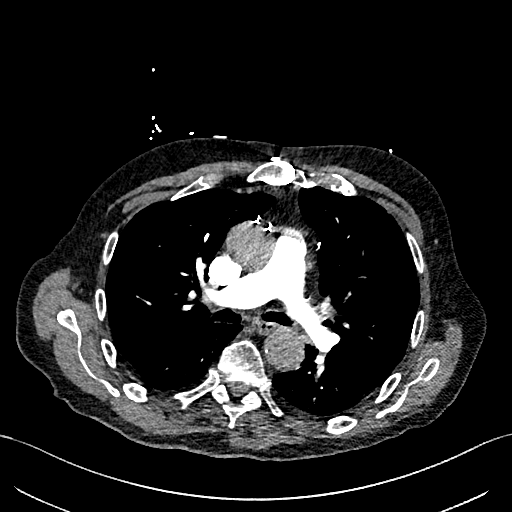
[im 237/395  lung]
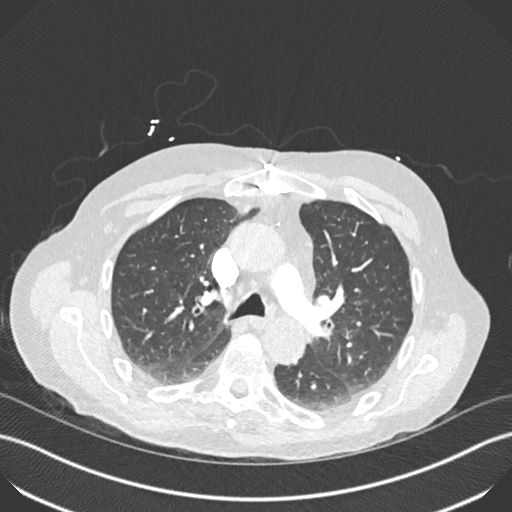
[im 257/395  mediastinal]
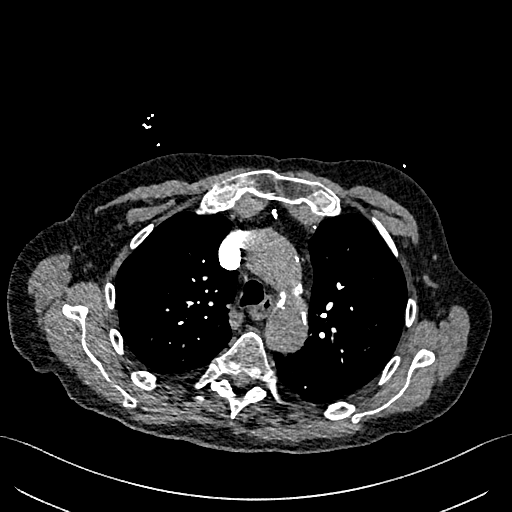
[im 276/395  lung]
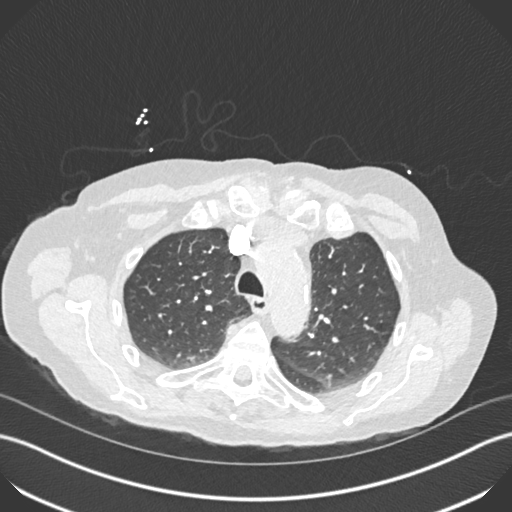
[im 296/395  mediastinal]
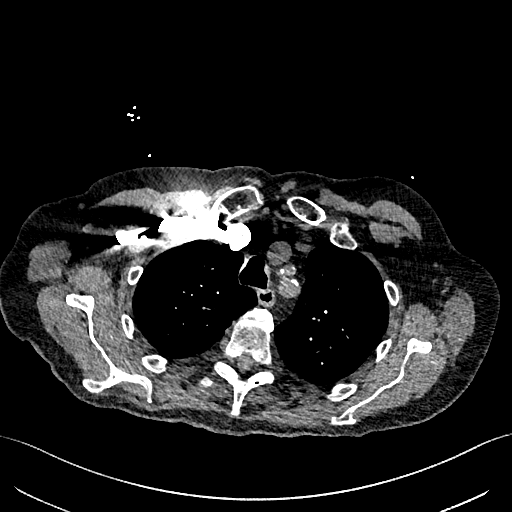
[im 316/395  lung]
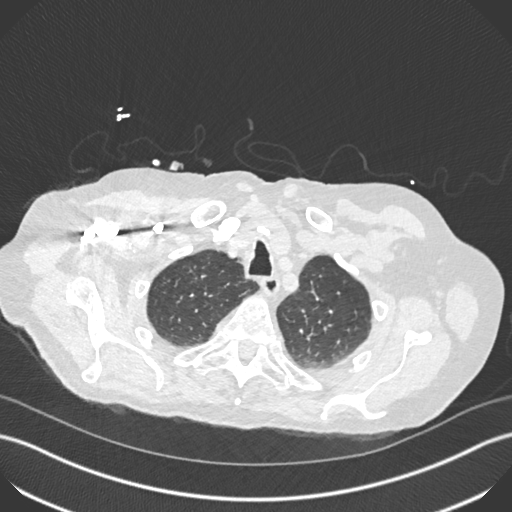
[im 335/395  mediastinal]
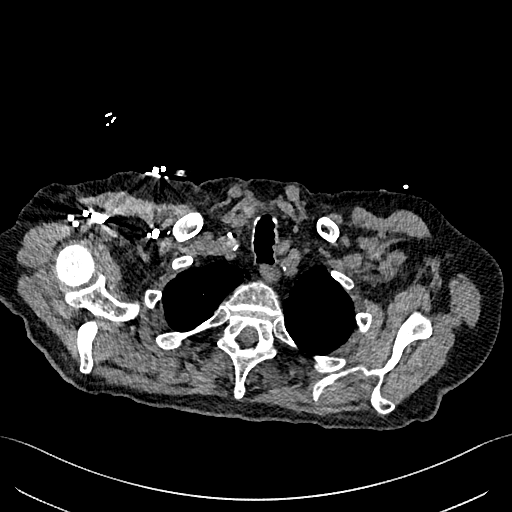
[im 355/395  lung]
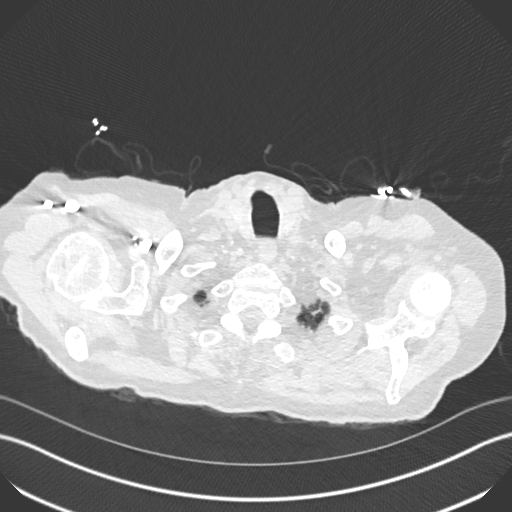
[im 375/395  mediastinal]
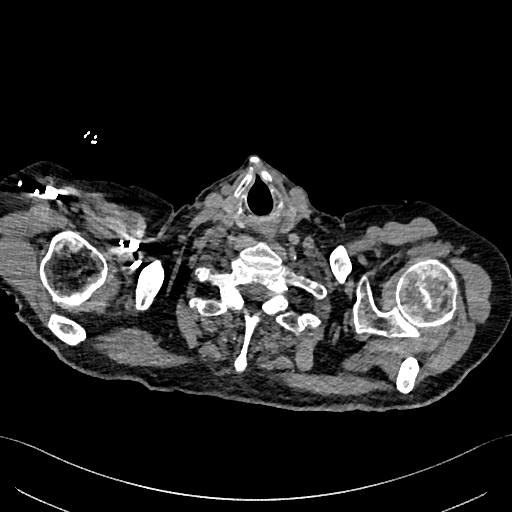

[Series 8: pe 2mm cor · coronal · 0.55mm/px · 1 of 120 slices shown]
[im 60/120  mediastinal]
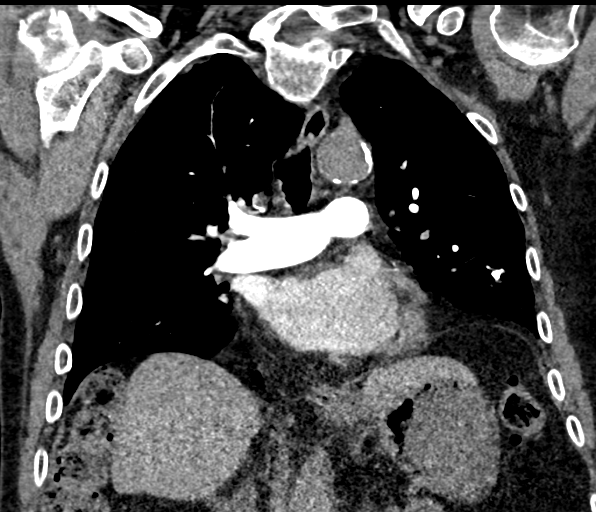

[19 of 36 positions shown; findings below may reference images not displayed]

FINDINGS: Cardiovascular: There is no cardiomegaly or pericardial effusion.
Three-vessel coronary vascular calcification and postsurgical
changes of CABG. There is moderate atherosclerotic calcification of
the thoracic aorta. Evaluation of the pulmonary arteries is somewhat
limited due to respiratory motion artifact. No pulmonary artery
embolus identified.

Mediastinum/Nodes: There is no hilar or mediastinal adenopathy. The
esophagus is grossly unremarkable. No mediastinal fluid collection.

Lungs/Pleura: Bibasilar subpleural dependent atelectasis. There is a
6 mm calcified nodule or granuloma in the lingula. No focal
consolidation, pleural effusion, or pneumothorax. The central
airways are patent.

Upper Abdomen: Partially visualized gallstones.

Musculoskeletal: Osteopenia with degenerative changes of the spine.
Median sternotomy wires. No acute osseous pathology.

Review of the MIP images confirms the above findings.
IMPRESSION: 1. No acute intrathoracic pathology. No CT evidence of pulmonary
embolism.
2. Cholelithiasis.
3. Aortic Atherosclerosis ([id number]).

## 2023-09-29 DEATH — deceased
